# Patient Record
Sex: Female | Born: 1935 | Race: White | Hispanic: No | Smoking: Never smoker
Health system: Southern US, Community
[De-identification: ages and names within clinical notes are randomized; demographics above are authoritative.]

## PROBLEM LIST (undated history)

## (undated) DIAGNOSIS — N329 Bladder disorder, unspecified: Secondary | ICD-10-CM

## (undated) DIAGNOSIS — K5900 Constipation, unspecified: Secondary | ICD-10-CM

## (undated) DIAGNOSIS — K219 Gastro-esophageal reflux disease without esophagitis: Secondary | ICD-10-CM

## (undated) DIAGNOSIS — H409 Unspecified glaucoma: Secondary | ICD-10-CM

## (undated) DIAGNOSIS — C801 Malignant (primary) neoplasm, unspecified: Secondary | ICD-10-CM

## (undated) DIAGNOSIS — B029 Zoster without complications: Secondary | ICD-10-CM

## (undated) DIAGNOSIS — R269 Unspecified abnormalities of gait and mobility: Secondary | ICD-10-CM

## (undated) DIAGNOSIS — Z8719 Personal history of other diseases of the digestive system: Secondary | ICD-10-CM

## (undated) DIAGNOSIS — I6529 Occlusion and stenosis of unspecified carotid artery: Secondary | ICD-10-CM

## (undated) DIAGNOSIS — IMO0002 Reserved for concepts with insufficient information to code with codable children: Secondary | ICD-10-CM

## (undated) DIAGNOSIS — IMO0001 Reserved for inherently not codable concepts without codable children: Secondary | ICD-10-CM

## (undated) DIAGNOSIS — Z79890 Hormone replacement therapy (postmenopausal): Secondary | ICD-10-CM

## (undated) DIAGNOSIS — D0510 Intraductal carcinoma in situ of unspecified breast: Secondary | ICD-10-CM

## (undated) DIAGNOSIS — M810 Age-related osteoporosis without current pathological fracture: Secondary | ICD-10-CM

## (undated) DIAGNOSIS — Z923 Personal history of irradiation: Secondary | ICD-10-CM

## (undated) DIAGNOSIS — Z87442 Personal history of urinary calculi: Secondary | ICD-10-CM

## (undated) DIAGNOSIS — Z972 Presence of dental prosthetic device (complete) (partial): Secondary | ICD-10-CM

## (undated) DIAGNOSIS — N200 Calculus of kidney: Secondary | ICD-10-CM

## (undated) DIAGNOSIS — I700 Atherosclerosis of aorta: Secondary | ICD-10-CM

## (undated) DIAGNOSIS — D649 Anemia, unspecified: Secondary | ICD-10-CM

## (undated) DIAGNOSIS — I2510 Atherosclerotic heart disease of native coronary artery without angina pectoris: Secondary | ICD-10-CM

## (undated) DIAGNOSIS — R159 Full incontinence of feces: Secondary | ICD-10-CM

## (undated) DIAGNOSIS — H9190 Unspecified hearing loss, unspecified ear: Secondary | ICD-10-CM

## (undated) DIAGNOSIS — M1990 Unspecified osteoarthritis, unspecified site: Secondary | ICD-10-CM

## (undated) DIAGNOSIS — M7750 Other enthesopathy of unspecified foot: Secondary | ICD-10-CM

## (undated) HISTORY — DX: Unspecified abnormalities of gait and mobility: R26.9

## (undated) HISTORY — DX: Unspecified hearing loss, unspecified ear: H91.90

## (undated) HISTORY — DX: Calculus of kidney: N20.0

## (undated) HISTORY — DX: Zoster without complications: B02.9

## (undated) HISTORY — DX: Occlusion and stenosis of unspecified carotid artery: I65.29

## (undated) HISTORY — PX: LITHOTRIPSY: SUR834

## (undated) HISTORY — DX: Malignant (primary) neoplasm, unspecified: C80.1

## (undated) HISTORY — DX: Gastro-esophageal reflux disease without esophagitis: K21.9

## (undated) HISTORY — DX: Reserved for concepts with insufficient information to code with codable children: IMO0002

## (undated) HISTORY — DX: Constipation, unspecified: K59.00

## (undated) HISTORY — DX: Atherosclerotic heart disease of native coronary artery without angina pectoris: I25.10

## (undated) HISTORY — DX: Full incontinence of feces: R15.9

## (undated) HISTORY — DX: Bladder disorder, unspecified: N32.9

## (undated) HISTORY — DX: Atherosclerosis of aorta: I70.0

## (undated) HISTORY — DX: Reserved for inherently not codable concepts without codable children: IMO0001

## (undated) HISTORY — DX: Age-related osteoporosis without current pathological fracture: M81.0

## (undated) HISTORY — DX: Anemia, unspecified: D64.9

## (undated) HISTORY — DX: Hormone replacement therapy: Z79.890

## (undated) HISTORY — DX: Unspecified osteoarthritis, unspecified site: M19.90

## (undated) HISTORY — PX: OTHER SURGICAL HISTORY: SHX169

## (undated) HISTORY — DX: Unspecified glaucoma: H40.9

---

## 1975-06-19 HISTORY — PX: VAGINAL HYSTERECTOMY: SUR661

## 1980-06-18 HISTORY — PX: HERNIA REPAIR: SHX51

## 1993-06-18 HISTORY — PX: CHOLECYSTECTOMY: SHX55

## 1997-11-01 ENCOUNTER — Other Ambulatory Visit: Admission: RE | Admit: 1997-11-01 | Discharge: 1997-11-01 | Payer: Self-pay | Admitting: *Deleted

## 1998-01-06 ENCOUNTER — Ambulatory Visit (HOSPITAL_COMMUNITY): Admission: RE | Admit: 1998-01-06 | Discharge: 1998-01-06 | Payer: Self-pay | Admitting: Gastroenterology

## 1999-01-17 ENCOUNTER — Encounter (INDEPENDENT_AMBULATORY_CARE_PROVIDER_SITE_OTHER): Payer: Self-pay | Admitting: Specialist

## 1999-01-17 ENCOUNTER — Other Ambulatory Visit: Admission: RE | Admit: 1999-01-17 | Discharge: 1999-01-17 | Payer: Self-pay | Admitting: Gastroenterology

## 1999-02-02 ENCOUNTER — Other Ambulatory Visit: Admission: RE | Admit: 1999-02-02 | Discharge: 1999-02-02 | Payer: Self-pay | Admitting: *Deleted

## 1999-05-26 ENCOUNTER — Observation Stay (HOSPITAL_COMMUNITY): Admission: RE | Admit: 1999-05-26 | Discharge: 1999-05-27 | Payer: Self-pay | Admitting: Urology

## 1999-12-05 ENCOUNTER — Other Ambulatory Visit: Admission: RE | Admit: 1999-12-05 | Discharge: 1999-12-05 | Payer: Self-pay | Admitting: Gastroenterology

## 1999-12-05 ENCOUNTER — Encounter (INDEPENDENT_AMBULATORY_CARE_PROVIDER_SITE_OTHER): Payer: Self-pay | Admitting: Specialist

## 2001-02-06 ENCOUNTER — Encounter (INDEPENDENT_AMBULATORY_CARE_PROVIDER_SITE_OTHER): Payer: Self-pay | Admitting: Specialist

## 2001-02-06 ENCOUNTER — Other Ambulatory Visit: Admission: RE | Admit: 2001-02-06 | Discharge: 2001-02-06 | Payer: Self-pay | Admitting: Gastroenterology

## 2001-02-13 ENCOUNTER — Encounter: Payer: Self-pay | Admitting: Gastroenterology

## 2001-02-13 ENCOUNTER — Ambulatory Visit (HOSPITAL_COMMUNITY): Admission: RE | Admit: 2001-02-13 | Discharge: 2001-02-13 | Payer: Self-pay | Admitting: Gastroenterology

## 2001-07-21 ENCOUNTER — Other Ambulatory Visit: Admission: RE | Admit: 2001-07-21 | Discharge: 2001-07-21 | Payer: Self-pay | Admitting: Obstetrics and Gynecology

## 2002-10-01 ENCOUNTER — Other Ambulatory Visit: Admission: RE | Admit: 2002-10-01 | Discharge: 2002-10-01 | Payer: Self-pay | Admitting: Obstetrics and Gynecology

## 2004-02-17 DIAGNOSIS — C801 Malignant (primary) neoplasm, unspecified: Secondary | ICD-10-CM

## 2004-02-17 HISTORY — DX: Malignant (primary) neoplasm, unspecified: C80.1

## 2004-02-17 HISTORY — PX: OTHER SURGICAL HISTORY: SHX169

## 2008-09-10 ENCOUNTER — Encounter: Admission: RE | Admit: 2008-09-10 | Discharge: 2008-09-10 | Payer: Self-pay | Admitting: Family Medicine

## 2009-04-11 ENCOUNTER — Ambulatory Visit (HOSPITAL_COMMUNITY): Admission: RE | Admit: 2009-04-11 | Discharge: 2009-04-11 | Payer: Self-pay | Admitting: Urology

## 2009-12-01 ENCOUNTER — Telehealth: Payer: Self-pay | Admitting: Internal Medicine

## 2010-06-18 HISTORY — PX: COLONOSCOPY: SHX174

## 2010-11-17 DIAGNOSIS — N329 Bladder disorder, unspecified: Secondary | ICD-10-CM

## 2010-11-17 HISTORY — PX: INCONTINENCE SURGERY: SHX676

## 2010-11-17 HISTORY — DX: Bladder disorder, unspecified: N32.9

## 2010-11-20 ENCOUNTER — Ambulatory Visit (HOSPITAL_BASED_OUTPATIENT_CLINIC_OR_DEPARTMENT_OTHER)
Admission: RE | Admit: 2010-11-20 | Discharge: 2010-11-21 | Disposition: A | Discharge status: Home / Self Care | Payer: Medicare Other | Source: Ambulatory Visit | Attending: Urology | Admitting: Urology

## 2010-11-20 ENCOUNTER — Ambulatory Visit (HOSPITAL_COMMUNITY)
Admission: RE | Admit: 2010-11-20 | Discharge: 2010-11-20 | Disposition: A | Discharge status: Home / Self Care | Payer: Medicare Other | Source: Ambulatory Visit | Attending: Urology | Admitting: Urology

## 2010-11-20 DIAGNOSIS — Z01818 Encounter for other preprocedural examination: Secondary | ICD-10-CM | POA: Insufficient documentation

## 2010-11-20 DIAGNOSIS — Z01812 Encounter for preprocedural laboratory examination: Secondary | ICD-10-CM | POA: Insufficient documentation

## 2010-11-20 DIAGNOSIS — K449 Diaphragmatic hernia without obstruction or gangrene: Secondary | ICD-10-CM | POA: Insufficient documentation

## 2010-11-20 DIAGNOSIS — N816 Rectocele: Secondary | ICD-10-CM | POA: Insufficient documentation

## 2010-11-20 DIAGNOSIS — Z01810 Encounter for preprocedural cardiovascular examination: Secondary | ICD-10-CM | POA: Insufficient documentation

## 2010-11-20 DIAGNOSIS — N393 Stress incontinence (female) (male): Secondary | ICD-10-CM | POA: Insufficient documentation

## 2010-11-20 LAB — POCT HEMOGLOBIN-HEMACUE: Hemoglobin: 14 g/dL (ref 12.0–15.0)

## 2010-11-23 NOTE — Op Note (Signed)
NAMESONDRA, BLIXT                ACCOUNT NO.:  [id number]  MEDICAL RECORD NO.:  [id number]  LOCATION:  XRAY                         FACILITY:  [facility]  PHYSICIAN:  Lakitha Gordy I. Patsi Sears, M.D.DATE OF BIRTH:  [birth year]  DATE OF PROCEDURE: DATE OF DISCHARGE:                              OPERATIVE REPORT   PREOPERATIVE DIAGNOSIS:  Stress urinary incontinence with intrinsic sphincter deficiency.  POSTOPERATIVE DIAGNOSIS:  Stress urinary incontinence with intrinsic sphincter deficiency.  OPERATION:  Lynx pubovaginal sling.  SURGEON:  Purvi Ruehl I. Patsi Sears, M.D.  ANESTHESIA:  General LMA.  OPERATION:  After appropriate preanesthesia, the patient is brought to the operating room, placed on the operating table in dorsal supine position where general LMA anesthesia was introduced.  She was then re- placed in dorsal lithotomy position where the pubis was prepped with Betadine solution and draped in usual fashion.  BRIEF HISTORY:  This is a 77 year old female who is status post TAH, and "bladder tacking" in 1978 per GYN.  She is also status post a dermal graft anterior vault and rectocele repair and sling using cardinal ligaments 12 years ago.  She currently leaks cough, laugh, sneeze, and leaks without awareness.  The urodynamics shows a low leak point pressure of 47 cm of water, repeat is 47, and third repeat is also low. She has a PVR of 67 mL and a flow rate of 8 mL per second.  She is now for lynx pubovaginal sling.  She has a known rectocele but is thought to be grade 2 and not in need of repair yet.  PROCEDURE:  Vaginal inspection reveals that the patient has indeed a posterior rectocele, grade 2.  There is no cystocele present.  The bladder neck is identified and marked with a blue pen.  Urethra appears tight.  Midurethral area is marked with a blue pen.  Two incisions were made two fingerbreadths up and 2 fingerbreadths lateral ward above the pubic tubercle, and following  these incisions, injection was accomplished in the vaginal area with Marcaine 0.25 with epinephrine 1:200,000 mixed in saline for hydrodissection.  10 mL is injected.  A midurethral incision is made measuring 15 mm.  Following this, dissection was accomplished on either side of the urethra, into the retropubic space.  There is a large amount of scar on the left side, but minimal score on the right side.  Following careful dissection on the left side, the lynx needles are placed bilaterally.  Cystoscopy was accomplished with both the 30-degree lens and the 70-degree lens, and no evidence of any bladder interruption was seen.  The needles appeared to exit the perineum at the level of bladder neck.  The mesh lynx sling was placed, and brought retropubically, so it is smooth and flat against the urethra.  Following this, the plastic sleeves are cut and removed with a right angle behind the sling in the midline.  Again, with the right angle behind the mesh in the midline, the wings were cut subcutaneously.  Wound was irrigated with antibiotic irrigation and closed in single layer with 2-0 Vicryl suture.  The suprapubic incisions were closed with Dermabond.  Vaginal packing was placed.  It was felt that the  patient did not have a bad enough rectocele to warrant treatment at this time.  Foley catheter was replaced.  After repeat cystoscopy showed indigo carmine urine and jets of indigo carmine from each ureter.  The patient received B and O suppository, IV Toradol.  She was awakened and taken to recovery in excellent condition.     Kentarius Partington I. Patsi Sears, M.D.     SIT/MEDQ  D:  11/20/2010  T:  11/20/2010  Job:  [id number]  Electronically Signed by Jethro Bolus M.D. on 11/23/2010 11:23:29 AM

## 2010-12-11 NOTE — Telephone Encounter (Signed)
Summary: Schedule Colonoscopy  Phone Note Outgoing Call Call back at Home Phone [phone]   Call placed by: Harlow Mares CMA [facility]),  December 01, 2009 2:41 PM Call placed to: Patient Summary of Call: called and spoke with the pts husband and he will give our number and Dr. Teresita Madura name to the patient when she returns home today so she can call back to schedule an office visit to discuss a colonoscopy. Initial call taken by: Harlow Mares CMA [facility]),  December 01, 2009 2:42 PM

## 2011-06-15 ENCOUNTER — Other Ambulatory Visit: Payer: Self-pay | Admitting: Family Medicine

## 2011-06-15 DIAGNOSIS — Z780 Asymptomatic menopausal state: Secondary | ICD-10-CM

## 2011-06-15 DIAGNOSIS — Z1231 Encounter for screening mammogram for malignant neoplasm of breast: Secondary | ICD-10-CM

## 2011-07-04 ENCOUNTER — Ambulatory Visit
Admission: RE | Admit: 2011-07-04 | Discharge: 2011-07-04 | Disposition: A | Discharge status: Home / Self Care | Payer: Medicare Other | Source: Ambulatory Visit | Attending: Family Medicine | Admitting: Family Medicine

## 2011-07-04 DIAGNOSIS — Z780 Asymptomatic menopausal state: Secondary | ICD-10-CM

## 2011-07-04 DIAGNOSIS — Z1231 Encounter for screening mammogram for malignant neoplasm of breast: Secondary | ICD-10-CM

## 2011-12-17 HISTORY — PX: FOOT SURGERY: SHX648

## 2012-07-23 ENCOUNTER — Other Ambulatory Visit (HOSPITAL_COMMUNITY)
Admission: RE | Admit: 2012-07-23 | Discharge: 2012-07-23 | Disposition: A | Discharge status: Home / Self Care | Payer: Medicare Other | Source: Ambulatory Visit | Attending: Family Medicine | Admitting: Family Medicine

## 2012-07-23 ENCOUNTER — Other Ambulatory Visit: Payer: Self-pay | Admitting: Family Medicine

## 2012-07-23 DIAGNOSIS — Z124 Encounter for screening for malignant neoplasm of cervix: Secondary | ICD-10-CM | POA: Insufficient documentation

## 2012-12-10 ENCOUNTER — Encounter: Payer: Self-pay | Admitting: Vascular Surgery

## 2012-12-10 ENCOUNTER — Other Ambulatory Visit: Payer: Self-pay

## 2012-12-10 DIAGNOSIS — R0989 Other specified symptoms and signs involving the circulatory and respiratory systems: Secondary | ICD-10-CM

## 2013-01-07 ENCOUNTER — Encounter: Payer: Self-pay | Admitting: Vascular Surgery

## 2013-01-08 ENCOUNTER — Ambulatory Visit (INDEPENDENT_AMBULATORY_CARE_PROVIDER_SITE_OTHER): Payer: Medicare Other | Admitting: Vascular Surgery

## 2013-01-08 ENCOUNTER — Encounter: Payer: Self-pay | Admitting: Vascular Surgery

## 2013-01-08 ENCOUNTER — Other Ambulatory Visit (INDEPENDENT_AMBULATORY_CARE_PROVIDER_SITE_OTHER): Payer: Medicare Other

## 2013-01-08 DIAGNOSIS — I6529 Occlusion and stenosis of unspecified carotid artery: Secondary | ICD-10-CM

## 2013-01-08 DIAGNOSIS — Z9289 Personal history of other medical treatment: Secondary | ICD-10-CM

## 2013-01-08 DIAGNOSIS — R0989 Other specified symptoms and signs involving the circulatory and respiratory systems: Secondary | ICD-10-CM

## 2013-01-08 NOTE — Progress Notes (Signed)
History of Present Illness:  Patient is a 79 y.o. year old female who presents for evaluation of carotid stenosis.  The patient denies symptoms of TIA, amaurosis, or stroke.  The patient is currently on no antiplatelet therapy.  The carotid stenosis was found on duplex to evaluate for carotid bruit.  Other medical problems include prior history of esophageal cancer and reflux.  These are currently stable and followed by Dr. Wynelle Link.   Past Medical History  Diagnosis Date  . Carotid artery occlusion     Bruit  . Glaucoma   . Postmenopausal HRT (hormone replacement therapy)   . GERD (gastroesophageal reflux disease)   . Osteoporosis   . Allergy     seasonal  . Gait abnormality   . Incontinence of bowel   . Constipation   . Esophageal hiatal hernia     upper  . Bladder disorder June 2012    Sling  . Kidney stone   . Bone spur of right ankle   . Weakness   . Hearing impaired   . Dyspnea on exertion   . Arthritis     osteoarthritis  . Back pain   . Cancer 02/2004    Esophageal   . Anemia     Past Surgical History  Procedure Laterality Date  . Esopheageal cancer  02/2004    Removed   . Hernial repain  1980  . Hernia repair  1982    corrective surgery  . Incontinence surgery  june 2012  . Cholecystectomy  1995    Gall Bladder  . Abdominal hysterectomy  1997  . Exploration neck for postop hemorrhage / thrombosis / infection    . Foot surgery Right July 2013    Bone spur     Social History History  Substance Use Topics  . Smoking status: Never Smoker   . Smokeless tobacco: Never Used  . Alcohol Use: No    Family History Family History  Problem Relation Age of Onset  . Hernia Mother     Hiatal  . Diabetes Mother   . Heart attack Mother 13  . Stroke Mother 65  . Hypertension Father   . Stroke Father 64  . Diabetes Father   . Hyperlipidemia Father   . Stroke Sister   . Cancer Brother     colon   . Heart attack Maternal Grandmother   . Hernia Maternal Grandmother    . Stroke Paternal Grandmother 66  . Stroke Paternal Grandfather 69  . Cancer Brother     Esophageal    Allergies  Allergies  Allergen Reactions  . Shellfish Allergy Diarrhea, Nausea Only and Rash  . Iodine      Current Outpatient Prescriptions  Medication Sig Dispense Refill  . b complex vitamins tablet Take 1 tablet by mouth daily.      . Biotin 1000 MCG tablet Take 1,000 mcg by mouth 3 (three) times daily. Hair, skin, nails      . calcium carbonate (OS-CAL) 600 MG TABS Take 600 mg by mouth 2 (two) times daily with a meal.      . Cholecalciferol (VITAMIN D3) 3000 UNITS TABS Take 2,000 mg by mouth.      . diphenhydrAMINE (BENADRYL) 25 MG tablet Take 25 mg by mouth every 6 (six) hours as needed for itching.      . esomeprazole (NEXIUM) 40 MG capsule Take 40 mg by mouth daily before breakfast.      . estrogens, conjugated, (PREMARIN) 0.3 MG tablet Take  0.3 mg by mouth daily. Take daily for 21 days then do not take for 7 days.      . famotidine (PEPCID) 20 MG tablet Take 20 mg by mouth 2 (two) times daily.      . Loratadine (CLARITIN REDITABS) 5 MG TBDP Take by mouth.      . diphenhydrAMINE (SOMINEX) 25 MG tablet Take 25 mg by mouth at bedtime as needed for sleep.       No current facility-administered medications for this visit.    ROS:   General:  No weight loss, Fever, chills  HEENT: No recent headaches, no nasal bleeding, no visual changes, no sore throat  Neurologic: No dizziness, blackouts, seizures. No recent symptoms of stroke or mini- stroke. No recent episodes of slurred speech, or temporary blindness.  Cardiac: No recent episodes of chest pain/pressure, no shortness of breath at rest.  No shortness of breath with exertion.  Denies history of atrial fibrillation or irregular heartbeat  Vascular: No history of rest pain in feet.  No history of claudication.  No history of non-healing ulcer, No history of DVT   Pulmonary: No home oxygen, no productive cough, no  hemoptysis,  No asthma or wheezing  Musculoskeletal:  [ ]  Arthritis, [ ]  Low back pain,  [ ]  Joint pain  Hematologic:No history of hypercoagulable state.  No history of easy bleeding.  No history of anemia  Gastrointestinal: No hematochezia or melena,  No gastroesophageal reflux, no trouble swallowing.  She has no history of peptic ulcer disease.  Urinary: [ ]  chronic Kidney disease, [ ]  on HD - [ ]  MWF or [ ]  TTHS, [ ]  Burning with urination, [ ]  Frequent urination, [ ]  Difficulty urinating;   Skin: No rashes  Psychological: No history of anxiety,  No history of depression   Physical Examination  Filed Vitals:   01/08/13 1043 01/08/13 1046  BP: 119/66 110/59  Pulse: 64 65  Resp: 16   Height: 5\' 2"  (1.575 m)   Weight: 139 lb (63.05 kg)   SpO2: 97%     Body mass index is 25.42 kg/(m^2).  General:  Alert and oriented, no acute distress HEENT: Normal Neck: No bruit or JVD Pulmonary: Clear to auscultation bilaterally Cardiac: Regular Rate and Rhythm without murmur Gastrointestinal: Soft, non-tender, non-distended, no mass, midline laparotomy scar was 4 cm ventral hernia defect, no palpable mass Skin: No rash Extremity Pulses:  2+ radial, brachial, femoral bilaterally Musculoskeletal: No deformity or edema  Neurologic: Upper and lower extremity motor 5/5 and symmetric  DATA:  Patient had a carotid duplex exam today I reviewed and interpreted this study. The right internal carotid artery was 40-60% left less than 40% she had antegrade vertebral flow bilaterally   ASSESSMENT:   Asymptomatic mild to moderate bilateral carotid stenosis right greater than left.   PLAN:   #1 start aspirin 81 mg once daily #2 patient needs a fasting lipid profile to consider whether or not she would benefit from a statin  #3 carotid duplex 6 months   Fabienne Bruns, MD [facility] Office: [phone] Pager: [phone]

## 2013-01-09 ENCOUNTER — Other Ambulatory Visit: Payer: Self-pay | Admitting: *Deleted

## 2013-01-13 LAB — LIPID PANEL
Cholesterol: 199 mg/dL (ref 0–200)
Triglycerides: 90 mg/dL (ref <150)

## 2013-01-14 ENCOUNTER — Encounter: Payer: Self-pay | Admitting: Vascular Surgery

## 2013-01-14 NOTE — Progress Notes (Unsigned)
Patient ID: Angelica Roberts, female   DOB: 01/04/1934, 79 y.o.   MRN: [id number]    Cholesterol  199   Triglycerides  90   <150 mg/dL HDL  75   >40 mg/dL Total CHOL/HDL Ratio  2.7   Ratio VLDL  18   0 - 40 mg/dL LDL Cholesterol  981 (H)   0 - 99 mg/dL Comments:   Pt called results discussed with pt and forwarded to primary MD  Fabienne Bruns, MD [facility] [place] Office: [phone] Pager: [phone]

## 2013-07-15 ENCOUNTER — Encounter: Payer: Self-pay | Admitting: Family

## 2013-07-16 ENCOUNTER — Ambulatory Visit (INDEPENDENT_AMBULATORY_CARE_PROVIDER_SITE_OTHER): Payer: Medicare HMO | Admitting: Family

## 2013-07-16 ENCOUNTER — Encounter: Payer: Self-pay | Admitting: Family

## 2013-07-16 ENCOUNTER — Ambulatory Visit (HOSPITAL_COMMUNITY)
Admission: RE | Admit: 2013-07-16 | Discharge: 2013-07-16 | Disposition: A | Discharge status: Home / Self Care | Payer: Medicare HMO | Source: Ambulatory Visit | Attending: Vascular Surgery | Admitting: Vascular Surgery

## 2013-07-16 VITALS — BP 105/67 | HR 70 | Resp 16 | Ht 62.0 in | Wt 144.0 lb

## 2013-07-16 DIAGNOSIS — I6529 Occlusion and stenosis of unspecified carotid artery: Secondary | ICD-10-CM | POA: Insufficient documentation

## 2013-07-16 DIAGNOSIS — I658 Occlusion and stenosis of other precerebral arteries: Secondary | ICD-10-CM | POA: Insufficient documentation

## 2013-07-16 NOTE — Patient Instructions (Signed)

## 2013-07-16 NOTE — Progress Notes (Signed)
Established Carotid Patient   History of Present Illness  Angelica Roberts is a 80 y.o. female patient of Dr. Oneida Alar who has known carotid stenosis. She returns today for carotid artery surveillance.  Patient has not had previous carotid artery intervention.  Patient has Negative history of TIA or stroke symptom.  The patient denies amaurosis fugax or monocular blindness.  The patient  denies facial drooping.  Pt. denies hemiplegia.  The patient denies receptive or expressive aphasia.  Pt. denies extremity weakness. Pt denies claudication symptoms, denies non healing wounds. Pt states she has bad GERD and has a history of esophogeal cancer.  Pt denies New Medical or Surgical History. Pt states she mows her large yard every week with a push mower, and walks a mile daily.  Pt Diabetic: No Pt smoker: non-smoker  Pt meds include: Statin : No: Dr. Oneida Alar checked her cholesterol, pt states it was good and she does not need medication ASA: No Other anticoagulants/antiplatelets: no   Past Medical History  Diagnosis Date  . Carotid artery occlusion     Bruit  . Glaucoma   . Postmenopausal HRT (hormone replacement therapy)   . GERD (gastroesophageal reflux disease)   . Osteoporosis   . Allergy     seasonal  . Gait abnormality   . Incontinence of bowel   . Constipation   . Esophageal hiatal hernia     upper  . Bladder disorder June 2012    Sling  . Kidney stone   . Bone spur of right ankle   . Weakness   . Hearing impaired   . Dyspnea on exertion   . Arthritis     osteoarthritis  . Back pain   . Cancer 02/2004    Esophageal   . Anemia     Social History History  Substance Use Topics  . Smoking status: Never Smoker   . Smokeless tobacco: Never Used  . Alcohol Use: No    Family History Family History  Problem Relation Age of Onset  . Hernia Mother     Hiatal  . Diabetes Mother   . Heart attack Mother 65  . Stroke Mother 29  . Hypertension Father   . Stroke  Father 51  . Diabetes Father   . Hyperlipidemia Father   . Stroke Sister   . Cancer Brother     colon   . Heart attack Maternal Grandmother   . Hernia Maternal Grandmother   . Stroke Paternal Grandmother 60  . Stroke Paternal Grandfather 9  . Cancer Brother     Esophageal    Surgical History Past Surgical History  Procedure Laterality Date  . Esopheageal cancer  02/2004    Removed   . Hernial repain  1980  . Hernia repair  1982    corrective surgery  . Incontinence surgery  june 2012  . Cholecystectomy  1995    Gall Bladder  . Abdominal hysterectomy  1997  . Exploration neck for postop hemorrhage / thrombosis / infection    . Foot surgery Right July 2013    Bone spur    Allergies  Allergen Reactions  . Shellfish Allergy Diarrhea, Nausea Only and Rash  . Iodine     Current Outpatient Prescriptions  Medication Sig Dispense Refill  . b complex vitamins tablet Take 1 tablet by mouth daily.      . Biotin 1000 MCG tablet Take 1,000 mcg by mouth 3 (three) times daily. Hair, skin, nails      .  calcium carbonate (OS-CAL) 600 MG TABS Take 600 mg by mouth 2 (two) times daily with a meal.      . Cholecalciferol (VITAMIN D3) 3000 UNITS TABS Take 2,000 mg by mouth.      . conjugated estrogens (PREMARIN) vaginal cream Place 1 Applicatorful vaginally every other day.      . denosumab (PROLIA) 60 MG/ML SOLN injection Inject 60 mg into the skin every 6 (six) months. Administer in upper arm, thigh, or abdomen      . diphenhydrAMINE (BENADRYL) 25 MG tablet Take 25 mg by mouth every 6 (six) hours as needed for itching.      . esomeprazole (NEXIUM) 40 MG capsule Take 40 mg by mouth daily before breakfast.      . famotidine (PEPCID) 20 MG tablet Take 20 mg by mouth 2 (two) times daily.      . Loratadine (CLARITIN REDITABS) 5 MG TBDP Take by mouth.      . diphenhydrAMINE (SOMINEX) 25 MG tablet Take 25 mg by mouth at bedtime as needed for sleep.      Marland Kitchen estrogens, conjugated, (PREMARIN) 0.3  MG tablet Take 0.3 mg by mouth daily. Take daily for 21 days then do not take for 7 days.       No current facility-administered medications for this visit.    Review of Systems : See HPI for pertinent positives and negatives.  Physical Examination  Filed Vitals:   07/16/13 1156  BP: 105/67  Pulse: 70  Resp: 16   Filed Weights   07/16/13 1156  Weight: 144 lb (65.318 kg)   Body mass index is 26.33 kg/(m^2).   General: WDWN female in NAD GAIT: normal Eyes: PERRLA Pulmonary:  CTAB, Negative  Rales, Negative rhonchi, & Negative wheezing.  Cardiac: regular Rhythm ,  Negative Murmurs.  VASCULAR EXAM Carotid Bruits Left Right   Negative Negative    Radial pulses are 2+ palpable and equal.                                                                                                                            LE Pulses LEFT RIGHT       POPLITEAL  not palpable   not palpable       POSTERIOR TIBIAL  not palpable    palpable        DORSALIS PEDIS      ANTERIOR TIBIAL  palpable   palpable     Gastrointestinal: soft, nontender, BS WNL, no r/g,  positive masses.  Musculoskeletal: Negative muscle atrophy/wasting. M/S 5/5 throughout, Extremities without ischemic changes.  Neurologic: A&O X 3; Appropriate Affect ; SENSATION ;normal;  Speech is normal CN 2-12 intact, Pain and light touch intact in extremities, Motor exam as listed above.   Non-Invasive Vascular Imaging CAROTID DUPLEX 07/16/2013   Right ICA: 40 - 59 % stenosis. Left ICA: no stenosis.  These findings are Unchanged from previous exam.  Assessment: Angelica Roberts is a  80 y.o. female who presents with asymptomatic mild Right ICA stenosis and no left ICA stenosis. The  ICA stenosis is  Unchanged from previous exam. Will not add daily 81 mg ASA as she has severe GERD and a history of Barrett's esophagus. Pt has no atherosclerotic risk factors other than age.  Plan: Follow-up in 1 year with Carotid Duplex  scan.   I discussed in depth with the patient the nature of atherosclerosis, and emphasized the importance of maximal medical management including strict control of blood pressure, blood glucose, and lipid levels, obtaining regular exercise, and continued cessation of smoking.  The patient is aware that without maximal medical management the underlying atherosclerotic disease process will progress, limiting the benefit of any interventions. The patient was given information about stroke prevention and what symptoms should prompt the patient to seek immediate medical care. Thank you for allowing [place] to participate in this patient's care.  Clemon Chambers, RN, MSN, FNP-C [facility] [place] Office: [phone]  Clinic Physician: Oneida Alar  07/16/2013 12:06 PM

## 2013-07-16 NOTE — Addendum Note (Signed)
Addended by: Mena Goes on: 07/16/2013 01:46 PM   Modules accepted: Orders

## 2013-08-26 ENCOUNTER — Other Ambulatory Visit: Payer: Self-pay

## 2013-08-26 DIAGNOSIS — Z1231 Encounter for screening mammogram for malignant neoplasm of breast: Secondary | ICD-10-CM

## 2013-09-03 ENCOUNTER — Other Ambulatory Visit: Payer: Self-pay | Admitting: Family Medicine

## 2013-09-03 DIAGNOSIS — M810 Age-related osteoporosis without current pathological fracture: Secondary | ICD-10-CM

## 2013-09-09 ENCOUNTER — Ambulatory Visit
Admission: RE | Admit: 2013-09-09 | Discharge: 2013-09-09 | Disposition: A | Discharge status: Home / Self Care | Payer: Medicare HMO | Source: Ambulatory Visit

## 2013-09-09 DIAGNOSIS — Z1231 Encounter for screening mammogram for malignant neoplasm of breast: Secondary | ICD-10-CM

## 2013-09-10 ENCOUNTER — Other Ambulatory Visit: Payer: Self-pay | Admitting: Family Medicine

## 2013-09-10 DIAGNOSIS — R928 Other abnormal and inconclusive findings on diagnostic imaging of breast: Secondary | ICD-10-CM

## 2013-09-15 ENCOUNTER — Ambulatory Visit
Admission: RE | Admit: 2013-09-15 | Discharge: 2013-09-15 | Disposition: A | Discharge status: Home / Self Care | Payer: Medicare HMO | Source: Ambulatory Visit | Attending: Family Medicine | Admitting: Family Medicine

## 2013-09-15 DIAGNOSIS — M810 Age-related osteoporosis without current pathological fracture: Secondary | ICD-10-CM

## 2013-09-21 ENCOUNTER — Ambulatory Visit
Admission: RE | Admit: 2013-09-21 | Discharge: 2013-09-21 | Disposition: A | Discharge status: Home / Self Care | Payer: Self-pay | Source: Ambulatory Visit | Attending: Family Medicine | Admitting: Family Medicine

## 2013-09-21 DIAGNOSIS — R928 Other abnormal and inconclusive findings on diagnostic imaging of breast: Secondary | ICD-10-CM

## 2013-09-21 HISTORY — PX: BREAST BIOPSY: SHX20

## 2013-09-22 ENCOUNTER — Other Ambulatory Visit: Payer: Self-pay | Admitting: Family Medicine

## 2013-09-22 DIAGNOSIS — D0510 Intraductal carcinoma in situ of unspecified breast: Secondary | ICD-10-CM

## 2013-09-24 ENCOUNTER — Telehealth: Payer: Self-pay | Admitting: *Deleted

## 2013-09-24 NOTE — Telephone Encounter (Signed)
Left message for a return phone call to schedule for [facility] 09/30/13.

## 2013-09-25 ENCOUNTER — Telehealth: Payer: Self-pay | Admitting: *Deleted

## 2013-09-25 DIAGNOSIS — C50412 Malignant neoplasm of upper-outer quadrant of left female breast: Secondary | ICD-10-CM

## 2013-09-25 NOTE — Telephone Encounter (Signed)
Confirmed [facility] for 09/30/13 at 12N .  Instructions and contact information given.

## 2013-09-28 ENCOUNTER — Ambulatory Visit
Admission: RE | Admit: 2013-09-28 | Discharge: 2013-09-28 | Disposition: A | Discharge status: Home / Self Care | Payer: Medicare HMO | Source: Ambulatory Visit | Attending: Family Medicine | Admitting: Family Medicine

## 2013-09-28 DIAGNOSIS — D0510 Intraductal carcinoma in situ of unspecified breast: Secondary | ICD-10-CM

## 2013-09-28 MED ORDER — GADOBENATE DIMEGLUMINE 529 MG/ML IV SOLN
13.0000 mL | Freq: Once | INTRAVENOUS | Status: AC | PRN
  Administered 2013-09-28: 13 mL via INTRAVENOUS

## 2013-09-30 ENCOUNTER — Ambulatory Visit
Admission: RE | Admit: 2013-09-30 | Discharge: 2013-09-30 | Disposition: A | Discharge status: Home / Self Care | Payer: Medicare HMO | Source: Ambulatory Visit | Attending: Radiation Oncology | Admitting: Radiation Oncology

## 2013-09-30 ENCOUNTER — Ambulatory Visit: Payer: Medicare HMO | Admitting: Physical Therapy

## 2013-09-30 ENCOUNTER — Encounter: Payer: Self-pay | Admitting: Radiation Oncology

## 2013-09-30 ENCOUNTER — Encounter: Payer: Self-pay | Admitting: Oncology

## 2013-09-30 ENCOUNTER — Ambulatory Visit (INDEPENDENT_AMBULATORY_CARE_PROVIDER_SITE_OTHER): Payer: Medicare HMO | Admitting: General Surgery

## 2013-09-30 ENCOUNTER — Ambulatory Visit: Payer: Medicare HMO

## 2013-09-30 ENCOUNTER — Ambulatory Visit (HOSPITAL_BASED_OUTPATIENT_CLINIC_OR_DEPARTMENT_OTHER): Payer: Medicare HMO | Admitting: Oncology

## 2013-09-30 ENCOUNTER — Other Ambulatory Visit (HOSPITAL_BASED_OUTPATIENT_CLINIC_OR_DEPARTMENT_OTHER): Payer: Medicare HMO

## 2013-09-30 VITALS — BP 131/72 | HR 74 | Temp 98.0°F | Resp 18 | Ht 62.0 in | Wt 143.4 lb

## 2013-09-30 DIAGNOSIS — Z170 Estrogen receptor positive status [ER+]: Secondary | ICD-10-CM

## 2013-09-30 DIAGNOSIS — D0590 Unspecified type of carcinoma in situ of unspecified breast: Secondary | ICD-10-CM

## 2013-09-30 DIAGNOSIS — C50919 Malignant neoplasm of unspecified site of unspecified female breast: Secondary | ICD-10-CM

## 2013-09-30 DIAGNOSIS — C50412 Malignant neoplasm of upper-outer quadrant of left female breast: Secondary | ICD-10-CM | POA: Insufficient documentation

## 2013-09-30 LAB — CBC WITH DIFFERENTIAL/PLATELET
BASO%: 0.5 % (ref 0.0–2.0)
BASOS ABS: 0 10*3/uL (ref 0.0–0.1)
EOS%: 1.7 % (ref 0.0–7.0)
Eosinophils Absolute: 0.1 10*3/uL (ref 0.0–0.5)
HCT: 39.6 % (ref 34.8–46.6)
HEMOGLOBIN: 12.9 g/dL (ref 11.6–15.9)
LYMPH#: 2.1 10*3/uL (ref 0.9–3.3)
LYMPH%: 25.1 % (ref 14.0–49.7)
MCH: 30.1 pg (ref 25.1–34.0)
MCHC: 32.7 g/dL (ref 31.5–36.0)
MCV: 92.3 fL (ref 79.5–101.0)
MONO#: 0.5 10*3/uL (ref 0.1–0.9)
MONO%: 5.7 % (ref 0.0–14.0)
NEUT#: 5.6 10*3/uL (ref 1.5–6.5)
NEUT%: 67 % (ref 38.4–76.8)
PLATELETS: 309 10*3/uL (ref 145–400)
RBC: 4.3 10*6/uL (ref 3.70–5.45)
RDW: 13.7 % (ref 11.2–14.5)
WBC: 8.4 10*3/uL (ref 3.9–10.3)

## 2013-09-30 LAB — COMPREHENSIVE METABOLIC PANEL (CC13)
ALT: 15 U/L (ref 0–55)
ANION GAP: 9 meq/L (ref 3–11)
AST: 21 U/L (ref 5–34)
Albumin: 3.8 g/dL (ref 3.5–5.0)
Alkaline Phosphatase: 74 U/L (ref 40–150)
BILIRUBIN TOTAL: 0.81 mg/dL (ref 0.20–1.20)
BUN: 15 mg/dL (ref 7.0–26.0)
CO2: 27 mEq/L (ref 22–29)
CREATININE: 0.9 mg/dL (ref 0.6–1.1)
Calcium: 9.8 mg/dL (ref 8.4–10.4)
Chloride: 108 mEq/L (ref 98–109)
Glucose: 105 mg/dl (ref 70–140)
Potassium: 3.7 mEq/L (ref 3.5–5.1)
Sodium: 144 mEq/L (ref 136–145)
Total Protein: 7.5 g/dL (ref 6.4–8.3)

## 2013-09-30 NOTE — Progress Notes (Signed)
Angelica Roberts [id number] Jan 25, 1934 80 y.o. 09/30/2013 1:28 PM  CC  Angelica Logan, MD [address], [address] [place] [address]  REASON FOR CONSULTATION:  80 year old female with ductal carcinoma in situ of the left breast. Patient is seen in the multidisciplinary breast clinic for discussion of treatment options.  STAGE:   Breast cancer   Primary site: Breast (Left)   Staging method: AJCC 7th Edition   Clinical: Stage 0 (Tis (DCIS), NX, cM0) signed by Deatra Robinson, MD on 09/30/2013  1:27 PM   Summary: Stage 0 (Tis (DCIS), NX, cM0)  REFERRING PHYSICIAN: Dr. Fanny Skates  HISTORY OF PRESENT ILLNESS:  Angelica Roberts is a 80 y.o. female.  Who is seen in the multidisciplinary breast clinic for new diagnosis of DCIS. Patient underwent a screening mammogram that showed suspicious calcifications in the upper-outer quadrant of the left breast. She had a biopsy performed that showed high grade ductal carcinoma in situ with associated necrosis and calcifications. MRI of the breasts showed biopsy changes in the upper outer quadrant of the left breast. No lymph nodes no evidence of any suspicious lesions on the right. Her case was discussed in the multidisciplinary breast conference. She is now seen in the [facility] for discussion of treatment options. She is without any complaints   Past Medical History: Past Medical History  Diagnosis Date  . Carotid artery occlusion     Bruit  . Glaucoma   . Postmenopausal HRT (hormone replacement therapy)   . GERD (gastroesophageal reflux disease)   . Osteoporosis   . Allergy     seasonal  . Gait abnormality   . Incontinence of bowel   . Constipation   . Esophageal hiatal hernia     upper  . Bladder disorder June 2012    Sling  . Kidney stone   . Bone spur of right ankle   . Weakness   . Hearing impaired   . Dyspnea on exertion   . Arthritis     osteoarthritis  . Back pain   . Cancer 02/2004    Esophageal   . Anemia      Past Surgical History: Past Surgical History  Procedure Laterality Date  . Esopheageal cancer  02/2004    Removed   . Hernial repain  1980  . Hernia repair  1982    corrective surgery  . Incontinence surgery  june 2012  . Cholecystectomy  1995    Gall Bladder  . Abdominal hysterectomy  1997  . Exploration neck for postop hemorrhage / thrombosis / infection    . Foot surgery Right July 2013    Bone spur    Family History: Family History  Problem Relation Age of Onset  . Hernia Mother     Hiatal  . Diabetes Mother   . Heart attack Mother 90  . Stroke Mother 42  . Hypertension Father   . Stroke Father 84  . Diabetes Father   . Hyperlipidemia Father   . Stroke Sister   . Cancer Brother     colon   . Heart attack Maternal Grandmother   . Hernia Maternal Grandmother   . Stroke Paternal Grandmother 72  . Stroke Paternal Grandfather 19  . Cancer Brother     Esophageal    Social History History  Substance Use Topics  . Smoking status: Never Smoker   . Smokeless tobacco: Never Used  . Alcohol Use: No    Allergies: Allergies  Allergen Reactions  .  Shellfish Allergy Diarrhea, Nausea Only and Rash  . Iodine     Current Medications: Current Outpatient Prescriptions  Medication Sig Dispense Refill  . b complex vitamins tablet Take 1 tablet by mouth daily.      . Biotin 1000 MCG tablet Take 1,000 mcg by mouth 3 (three) times daily. Hair, skin, nails      . calcium carbonate (OS-CAL) 600 MG TABS Take 600 mg by mouth 2 (two) times daily with a meal.      . Cholecalciferol (VITAMIN D3) 3000 UNITS TABS Take 2,000 mg by mouth.      . conjugated estrogens (PREMARIN) vaginal cream Place 1 Applicatorful vaginally every other day.      . denosumab (PROLIA) 60 MG/ML SOLN injection Inject 60 mg into the skin every 6 (six) months. Administer in upper arm, thigh, or abdomen      . diphenhydrAMINE (BENADRYL) 25 MG tablet Take 25 mg by mouth every 6 (six) hours as needed for  itching.      . diphenhydrAMINE (SOMINEX) 25 MG tablet Take 25 mg by mouth at bedtime as needed for sleep.      Marland Kitchen esomeprazole (NEXIUM) 40 MG capsule Take 40 mg by mouth daily before breakfast.      . estrogens, conjugated, (PREMARIN) 0.3 MG tablet Take 0.3 mg by mouth daily. Take daily for 21 days then do not take for 7 days.      . famotidine (PEPCID) 20 MG tablet Take 20 mg by mouth 2 (two) times daily.      . Loratadine (CLARITIN REDITABS) 5 MG TBDP Take by mouth.       No current facility-administered medications for this visit.    OB/GYN History: Menarche at age 44 she is postmenopausal she stopped hormone replacement therapy in 2005 first live birth at 44  Fertility Discussion: No Prior History of Cancer: Esophageal carcinoma  Health Maintenance:  Colonoscopy yes Bone Density yes Last PAP smear February 2014  ECOG PERFORMANCE STATUS: 1 - Symptomatic but completely ambulatory  Genetic Counseling/testing: Yes  REVIEW OF SYSTEMS:  A complete comprehensive 14 point review of systems was obtained and a scanned separately into the electronic medical record  PHYSICAL EXAMINATION: Blood pressure 131/72, pulse 74, temperature 98 F (36.7 C), temperature source Oral, resp. rate 18, height 5\' 2"  (1.575 m), weight 143 lb 6.4 oz (65.046 kg).  General:  well-nourished in no acute distress.  Eyes:  no scleral icterus.  ENT:  There were no oropharyngeal lesions.  Neck was without thyromegaly.  Lymphatics:  Negative cervical, supraclavicular or axillary adenopathy.  Respiratory: lungs were clear bilaterally without wheezing or crackles.  Cardiovascular:  Regular rate and rhythm, S1/S2, without murmur, rub or gallop.  There was no pedal edema.  GI:  abdomen was soft, flat, nontender, nondistended, without organomegaly.  Muscoloskeletal:  no spinal tenderness of palpation of vertebral spine.  Skin exam was without echymosis, petichae.  Neuro exam was nonfocal.  Patient was able to get on and off  exam table without assistance.  Gait was normal.  Patient was alerted and oriented.  Attention was good.   Language was appropriate.  Mood was normal without depression.  Speech was not pressured.  Thought content was not tangential.   Breasts: right breast normal without mass, skin or nipple changes or axillary nodes, left breast normal without mass, skin or nipple changes or axillary nodes.   STUDIES/RESULTS: Mr Breast Bilateral W Wo Contrast  09/28/2013   CLINICAL DATA:  80 year old  female with biopsy proven ductal carcinoma in situ with necrosis and calcifications from the upper-outer quadrant of the left breast.  LABS:  BUN and creatinine were obtained on site at [facility] at  [address]  Results:  BUN 11 mg/dL,  Creatinine 0.7 mg/dL.  EXAM: BILATERAL BREAST MRI WITH AND WITHOUT CONTRAST  TECHNIQUE: Multiplanar, multisequence MR images of both breasts were obtained prior to and following the intravenous administration of 53ml of MultiHance.  THREE-DIMENSIONAL MR IMAGE RENDERING ON INDEPENDENT WORKSTATION:  Three-dimensional MR images were rendered by post-processing of the original MR data on an independent workstation. The three-dimensional MR images were interpreted, and findings are reported in the following complete MRI report for this study. Three dimensional images were evaluated at the independent DynaCad workstation  COMPARISON:  Previous exams  FINDINGS: Breast composition: b.  Scattered fibroglandular tissue.  Background parenchymal enhancement: Moderate  Right breast: No mass or abnormal enhancement.  Left breast: Stereotactic biopsy was performed from a lateral approach. Biopsy changes are seen in the upper-outer quadrant of the breast. Non masslike patchy enhancement measuring 2.7 cm is seen around the biopsy site. Enhancement is likely secondary to biopsy changes. No additional areas of abnormal enhancement seen in left breast.  Lymph nodes: No abnormal appearing lymph  nodes.  Ancillary findings:  None.  IMPRESSION: Post biopsy changes in the upper-outer quadrant of the left breast corresponding to the area of the known ductal carcinoma in-situ.  RECOMMENDATION: Treatment planning of the known left breast ductal carcinoma in situ is recommended.  BI-RADS CATEGORY  6: Known biopsy-proven malignancy - appropriate action should be taken.   Electronically Signed   By: Lillia Mountain M.D.   On: 09/28/2013 13:12   Dg Bone Density  09/16/2013   CLINICAL DATA:  Postmenopausal.  EXAM: DUAL X-RAY ABSORPTIOMETRY (DXA) FOR BONE MINERAL DENSITY  FINDINGS: LEFT FEMUR NECK  Bone Mineral Density (BMD):  0.607 g/cm2  Young Adult T-Score: -2.2  Z-Score:  0.0  LEFT  FOREARM (1/3 RADIUS)  Bone Mineral Density (BMD):  0.542  Young Adult T Score:  -2.5  Z Score:  0.3  ASSESSMENT: Patient's diagnostic category is OSTEOPOROSIS by WHO Criteria.  FRACTURE RISK: INCREASED  FRAX: World Health Organization FRAX assessment of absolute fracture risk is not calculated for this patient because the patient has osteoporosis.  COMPARISON: The bone mineral density of the lumbar spine has increased by 8.8% compared to the 09/10/2008 study. The bone mineral density of the left forearm has not significantly changed compared to the 07/04/2011 study. For additional comparisons please see the mailed report.  Effective therapies are available in the form of bisphosphonates, selective estrogen receptor modulators, biologic agents, and hormone replacement therapy (for women). All patients should ensure an adequate intake of dietary calcium (1200 mg daily) and vitamin D (800 IU daily) unless contraindicated.  All treatment decisions require clinical judgment and consideration of individual patient factors, including patient preferences, co-morbidities, previous drug use, risk factors not captured in the FRAX model (e.g., frailty, falls, vitamin D deficiency, increased bone turnover, interval significant decline in bone density)  and possible under- or over-estimation of fracture risk by FRAX.  The National Osteoporosis Foundation recommends that FDA-approved medical therapies be considered in postmenopausal women and men age 7 or older with a:  1. Hip or vertebral (clinical or morphometric) fracture.  2. T-score of -2.5 or lower at the spine or hip.  3. Ten-year fracture probability by FRAX of 3% or greater for hip fracture  or 20% or greater for major osteoporotic fracture.  People with diagnosed cases of osteoporosis or at high risk for fracture should have regular bone mineral density tests. For patients eligible for Medicare, routine testing is allowed once every 2 years. The testing frequency can be increased to one year for patients who have rapidly progressing disease, those who are receiving or discontinuing medical therapy to restore bone mass, or have additional risk factors.  World Pharmacologist [facility]) Criteria:  Normal: T-scores from +1.0 to -1.0  Low Bone Mass (Osteopenia): T-scores between -1.0 and -2.5  Osteoporosis: T-scores -2.5 and below  Comparison to Reference Population:  T-score is the key measure used in the diagnosis of osteoporosis and relative risk determination for fracture. It provides a value for bone mass relative to the mean bone mass of a young adult reference population expressed in terms of standard deviation (SD).  Z-score is the age-matched score showing the patient's values compared to a population matched for age, sex, and race. This is also expressed in terms of standard deviation. The patient may have values that compare favorably to the age-matched values and still be at increased risk for fracture.   Electronically Signed   By: Lillia Mountain M.D.   On: 09/16/2013 13:04   Mm Digital Diagnostic Unilat L  09/21/2013   CLINICAL DATA:  Patient recalled from screening for left breast calcifications  EXAM: DIGITAL DIAGNOSTIC  LEFT MAMMOGRAM  COMPARISON:  [facility]  ACR Breast Density Category b: There  are scattered areas of fibroglandular density.  FINDINGS: There is a new 6 x 4 x 5 mm group of coarse heterogeneous calcifications within the upper-outer quadrant left breast middle depth.  IMPRESSION: New suspicious left breast calcifications.  RECOMMENDATION: Left breast stereotactic core needle biopsy. This will be performed same day, September 21, 2013  I have discussed the findings and recommendations with the patient. Results were also provided in writing at the conclusion of the visit. If applicable, a reminder letter will be sent to the patient regarding the next appointment.  BI-RADS CATEGORY  4: Suspicious abnormality - biopsy should be considered.   Electronically Signed   By: Lovey Newcomer M.D.   On: 09/21/2013 09:04   Mm Digital Screening Bilateral  09/09/2013   CLINICAL DATA:  Screening.  EXAM: DIGITAL SCREENING BILATERAL MAMMOGRAM WITH CAD  COMPARISON:  Previous Exam(s)  ACR Breast Density Category b: There are scattered areas of fibroglandular density.  FINDINGS: In the left breast, calcifications warrant further evaluation with magnified views. In the right breast, no findings suspicious for malignancy. Images were processed with CAD.  IMPRESSION: Further evaluation is suggested for calcifications in the left breast.  RECOMMENDATION: Diagnostic mammogram of the left breast. (Code:[id number]-58M)  The patient will be contacted regarding the findings, and additional imaging will be scheduled.  BI-RADS CATEGORY  0: Incomplete. Need additional imaging evaluation and/or prior mammograms for comparison.   Electronically Signed   By: Lillia Mountain M.D.   On: 09/09/2013 12:50   Mm Lt Breast Bx W Loc Dev 1st Lesion Image Bx Spec Stereo Guide  09/22/2013   ADDENDUM REPORT: 09/22/2013 14:11  ADDENDUM: I spoke with the patient on September 22, 2013 to relay pathology results from recent left breast stereotactic guided core needle biopsy. Pathology demonstrated ductal carcinoma in situ with necrosis and calcifications.  Additionally fibroadenoma and atypical lobular hyperplasia were demonstrated. Pathology results are concordant with imaging findings.  Surgical referral is recommended. The patient is scheduled for multidisciplinary breast conference  on 09/30/2013. Additionally the patient will be scheduled for a bilateral breast MRI at her convenience.  Patient states no complaints at the biopsy site.  Patient was instructed to call the breast center with any additional questions or concerns.   Electronically Signed   By: Lovey Newcomer M.D.   On: 09/22/2013 14:11   09/22/2013   CLINICAL DATA:  Suspicious left breast calcifications  EXAM: LEFT STEREOTACTIC CORE NEEDLE BIOPSY  COMPARISON:  Previous exams.  FINDINGS: The patient and I discussed the procedure of stereotactic-guided biopsy including benefits and alternatives. We discussed the high likelihood of a successful procedure. We discussed the risks of the procedure including infection, bleeding, tissue injury, clip migration, and inadequate sampling. Informed written consent was given. The usual time out protocol was performed immediately prior to the procedure.  Using sterile technique and 2% Lidocaine as local anesthetic, under stereotactic guidance, a 9 gauge vacuum assisted core needle biopsy device was used to perform core needle biopsy of calcifications within the upper-outer quadrant left breast using a lateral approach. Specimen radiograph was performed showing calcifications. Specimens with calcifications are identified for pathology.  At the conclusion of the procedure, an X shaped tissue marker clip was deployed into the biopsy cavity. Follow-up 2-view mammogram confirmed clip 6 mm medial to the biopsied calcifications. A few residual calcifications are identified on mammogram. .  IMPRESSION: Stereotactic-guided biopsy of left breast calcifications. No apparent complications.  Note biopsy marking clip is 6 mm medial to the biopsied calcifications. A few residual  calcifications are identified on mammogram.  Electronically Signed: By: Lovey Newcomer M.D. On: 09/21/2013 09:08     LABS:    Chemistry      Component Value Date/Time   NA 144 09/30/2013 1159   K 3.7 09/30/2013 1159   CO2 27 09/30/2013 1159   BUN 15.0 09/30/2013 1159   CREATININE 0.9 09/30/2013 1159      Component Value Date/Time   CALCIUM 9.8 09/30/2013 1159   ALKPHOS 74 09/30/2013 1159   AST 21 09/30/2013 1159   ALT 15 09/30/2013 1159   BILITOT 0.81 09/30/2013 1159      Lab Results  Component Value Date   WBC 8.4 09/30/2013   HGB 12.9 09/30/2013   HCT 39.6 09/30/2013   MCV 92.3 09/30/2013   PLT 309 09/30/2013     ASSESSMENT/PLAN   80 year old female with   1. Stage 0 (TisNx) high grade DCIS of the right/left breast found on screening mammogram. DCIS iThe DCIS was ER+/PR+   2. We spent the better part of today's hour-long appointment discussing the biology of breast cancer in general, and the specifics of the patient's tumor in particular. We discussed the multidisciplinary approach to breast cancer treatment. We went over her pathology. She understands that her cancer is a non invasive disease. She is now seen in the multidisciplinary breast clinic for discussion of treatment options. She is a good lumpectomy candidate followed by radiation and adjuvant antiestrogen therapy. We discussed the role of adjuvant anti-estrogen therapy to help prevent future ER+ breast cancer in the ipsilateral and contralateral breasts. We discussed the different drugs available including tamoxifen. Which would be 20 mg daily for a total of 5 years. We also discussed aromasin 25 mg daily for 5 years. We discussed the side effects of both medications.she will not begin anti-estrogen therapy until after completion of radiation therapy  3. patient will proceed with surgery first. Once she has had this then I will plan on  seeing her back. If her pathology is unchanged then she will certainly proceed with radiation  therapy. Followed by antiestrogen therapy. We discussed risks benefits and side effects of the antiestrogen therapy drugs. Definitive treatment plans will be made once patient returns after her surgery peer   Thank you so much for allowing me to participate in the care of Angelica Roberts I will continue to follow up the patient with you and assist in her care.  All questions were answered. The patient knows to call the clinic with any problems, questions or concerns. We can certainly see the patient much sooner if necessary.  I spent 30 minutes counseling the patient face to face. The total time spent in the appointment was 45 minutes.  Marcy Panning, MD Medical/Oncology [facility] [phone] (beeper) [phone] (Office)  09/30/2013, 1:28 PM

## 2013-09-30 NOTE — Progress Notes (Signed)
Checked in new pt with no financial concerns. °

## 2013-09-30 NOTE — Progress Notes (Signed)
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 female who is seen out of the courtesy of Dr. Fanny Skates as part of the multidisciplinary breast clinic. Earlier this year on routine screening mammography the patient was noted to have  suspicious calcifications within the upper outer quadrant of left breast. She ultimately underwent biopsy of this area which revealed ductal carcinoma in situ, high-grade associated with necrosis and calcifications.Marland Kitchen An MRI was performed which showed biopsy changes only in the upper-outer quadrant of the left breast. With this information the patient is now seen as part of the multidisciplinary breast clinic for evaluation.  PREVIOUS RADIATION THERAPY: No  PAST MEDICAL HISTORY:  has a past medical history of Carotid artery occlusion; Glaucoma; Postmenopausal HRT (hormone replacement therapy); GERD (gastroesophageal reflux disease); Osteoporosis; Allergy; Gait abnormality; Incontinence of bowel; Constipation; Esophageal hiatal hernia; Bladder disorder (June 2012); Kidney stone; Bone spur of right ankle; Weakness; Hearing impaired; Dyspnea on exertion; Arthritis; Back pain; Cancer (02/2004); and Anemia.  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 The patient did not receive any postoperative adjuvant treatment due to  Complications. She apparently had node positive disease at the time of her surgery  PAST SURGICAL HISTORY: Past Surgical History  Procedure Laterality Date  . Esopheageal cancer  02/2004    Removed   . Hernial repain  1980  . Hernia repair   1982    corrective surgery  . Incontinence surgery  june 2012  . Cholecystectomy  1995    Gall Bladder  . Abdominal hysterectomy  1997  . Exploration neck for postop hemorrhage / thrombosis / infection    . Foot surgery Right July 2013    Bone spur    FAMILY HISTORY: family history includes Breast cancer in her maternal uncle and sister; Cancer in her brother and brother; Diabetes in her father and mother; Heart attack in her maternal grandmother; Heart attack (age of onset: 74) in her mother; Hernia in her maternal grandmother and mother; Hyperlipidemia in her father; Hypertension in her father; Stroke in her sister; Stroke (age of onset: 68) in her mother and paternal grandmother; Stroke (age of onset: 12) in her father; Stroke (age of onset: 3) in her paternal grandfather.  SOCIAL HISTORY:  reports that she has never smoked. She has never used smokeless tobacco. She reports that she does not drink alcohol or use illicit drugs.  ALLERGIES: Shellfish allergy and Iodine  MEDICATIONS:  Current Outpatient Prescriptions  Medication Sig Dispense Refill  . b complex vitamins tablet Take 1 tablet by mouth daily.      . Biotin 1000 MCG tablet Take 1,000 mcg by mouth 3 (three) times daily. Hair, skin, nails      . calcium carbonate (OS-CAL) 600 MG TABS Take 600 mg by mouth 2 (two) times daily with a meal.      . Cholecalciferol (VITAMIN D3) 3000 UNITS TABS Take 2,000 mg by mouth.      . conjugated estrogens (PREMARIN) vaginal cream Place 1 Applicatorful vaginally every other day.      . denosumab (PROLIA) 60 MG/ML SOLN injection Inject 60  mg into the skin every 6 (six) months. Administer in upper arm, thigh, or abdomen      . diphenhydrAMINE (BENADRYL) 25 MG tablet Take 25 mg by mouth every 6 (six) hours as needed for itching.      . diphenhydrAMINE (SOMINEX) 25 MG tablet Take 25 mg by mouth at bedtime as needed for sleep.      Marland Kitchen esomeprazole (NEXIUM) 40 MG capsule Take 40 mg by mouth daily  before breakfast.      . estrogens, conjugated, (PREMARIN) 0.3 MG tablet Take 0.3 mg by mouth daily. Take daily for 21 days then do not take for 7 days.      . famotidine (PEPCID) 20 MG tablet Take 20 mg by mouth 2 (two) times daily.      . Loratadine (CLARITIN REDITABS) 5 MG TBDP Take by mouth.       No current facility-administered medications for this encounter.    REVIEW OF SYSTEMS:  A 15 point review of systems is documented in the electronic medical record. This was obtained by the nursing staff. However, I reviewed this with the patient to discuss relevant findings and make appropriate changes.  Prior to diagnosis the patient denied any pain in the breast area nipple discharge or bleeding. She denies any new bony pain headaches dizziness or blurred vision. She has chronic problems with reflux   PHYSICAL EXAM:   Vitals - 1 value per visit [id number]  SYSTOLIC [id number]  DIASTOLIC 72  Pulse 74  Temperature 98  Respirations 18  Weight (lb) 143.4  Height 5\' 2"   BMI 26.22  VISIT REPORT    Gen. this is a very pleasant healthy-appearing 80 year old female in no acute distress. She is accompanied by her 2 daughters on evaluation today. Examination of the neck and supraclavicular region reveals no evidence of adenopathy. The axillary areas are free of adenopathy. Examination of the lungs with some to be clear. The heart has a regular rhythm and rate. The abdomen is soft and nontender with normal bowel sounds. Scars are seen along the abdominal region from her prior esophageal surgery. Examination of the right breast reveals no mass or nipple discharge. Examination of the left breast reveals small biopsy site in the upper outer quadrant of the left breast. No dominant masses appreciated in the breast nipple discharge or bleeding.   ECOG = 0  0 - Asymptomatic (Fully active, able to carry on all predisease activities without restriction)   LABORATORY DATA:  Lab Results  Component Value Date    WBC 8.4 09/30/2013   HGB 12.9 09/30/2013   HCT 39.6 09/30/2013   MCV 92.3 09/30/2013   PLT 309 09/30/2013   NEUTROABS 5.6 09/30/2013   Lab Results  Component Value Date   NA 144 09/30/2013   K 3.7 09/30/2013   CO2 27 09/30/2013   GLUCOSE 105 09/30/2013   CREATININE 0.9 09/30/2013   CALCIUM 9.8 09/30/2013      RADIOGRAPHY: Mr Breast Bilateral W Wo Contrast  09/28/2013   CLINICAL DATA:  80 year old female with biopsy proven ductal carcinoma in situ with necrosis and calcifications from the upper-outer quadrant of the left breast.  LABS:  BUN and creatinine were obtained on site at [facility] at  [address]  Results:  BUN 11 mg/dL,  Creatinine 0.7 mg/dL.  EXAM: BILATERAL BREAST MRI WITH AND WITHOUT CONTRAST  TECHNIQUE: Multiplanar, multisequence MR images of both breasts were obtained prior to and following the intravenous administration of 65ml of  MultiHance.  THREE-DIMENSIONAL MR IMAGE RENDERING ON INDEPENDENT WORKSTATION:  Three-dimensional MR images were rendered by post-processing of the original MR data on an independent workstation. The three-dimensional MR images were interpreted, and findings are reported in the following complete MRI report for this study. Three dimensional images were evaluated at the independent DynaCad workstation  COMPARISON:  Previous exams  FINDINGS: Breast composition: b.  Scattered fibroglandular tissue.  Background parenchymal enhancement: Moderate  Right breast: No mass or abnormal enhancement.  Left breast: Stereotactic biopsy was performed from a lateral approach. Biopsy changes are seen in the upper-outer quadrant of the breast. Non masslike patchy enhancement measuring 2.7 cm is seen around the biopsy site. Enhancement is likely secondary to biopsy changes. No additional areas of abnormal enhancement seen in left breast.  Lymph nodes: No abnormal appearing lymph nodes.  Ancillary findings:  None.  IMPRESSION: Post biopsy changes in the upper-outer  quadrant of the left breast corresponding to the area of the known ductal carcinoma in-situ.  RECOMMENDATION: Treatment planning of the known left breast ductal carcinoma in situ is recommended.  BI-RADS CATEGORY  6: Known biopsy-proven malignancy - appropriate action should be taken.   Electronically Signed   By: Lillia Mountain M.D.   On: 09/28/2013 13:12   Dg Bone Density  09/16/2013   CLINICAL DATA:  Postmenopausal.  EXAM: DUAL X-RAY ABSORPTIOMETRY (DXA) FOR BONE MINERAL DENSITY  FINDINGS: LEFT FEMUR NECK  Bone Mineral Density (BMD):  0.607 g/cm2  Young Adult T-Score: -2.2  Z-Score:  0.0  LEFT  FOREARM (1/3 RADIUS)  Bone Mineral Density (BMD):  0.542  Young Adult T Score:  -2.5  Z Score:  0.3  ASSESSMENT: Patient's diagnostic category is OSTEOPOROSIS by WHO Criteria.  FRACTURE RISK: INCREASED  FRAX: World Health Organization FRAX assessment of absolute fracture risk is not calculated for this patient because the patient has osteoporosis.  COMPARISON: The bone mineral density of the lumbar spine has increased by 8.8% compared to the 09/10/2008 study. The bone mineral density of the left forearm has not significantly changed compared to the 07/04/2011 study. For additional comparisons please see the mailed report.  Effective therapies are available in the form of bisphosphonates, selective estrogen receptor modulators, biologic agents, and hormone replacement therapy (for women). All patients should ensure an adequate intake of dietary calcium (1200 mg daily) and vitamin D (800 IU daily) unless contraindicated.  All treatment decisions require clinical judgment and consideration of individual patient factors, including patient preferences, co-morbidities, previous drug use, risk factors not captured in the FRAX model (e.g., frailty, falls, vitamin D deficiency, increased bone turnover, interval significant decline in bone density) and possible under- or over-estimation of fracture risk by FRAX.  The National  Osteoporosis Foundation recommends that FDA-approved medical therapies be considered in postmenopausal women and men age 27 or older with a:  1. Hip or vertebral (clinical or morphometric) fracture.  2. T-score of -2.5 or lower at the spine or hip.  3. Ten-year fracture probability by FRAX of 3% or greater for hip fracture or 20% or greater for major osteoporotic fracture.  People with diagnosed cases of osteoporosis or at high risk for fracture should have regular bone mineral density tests. For patients eligible for Medicare, routine testing is allowed once every 2 years. The testing frequency can be increased to one year for patients who have rapidly progressing disease, those who are receiving or discontinuing medical therapy to restore bone mass, or have additional risk factors.  World Pharmacologist [facility])  Criteria:  Normal: T-scores from +1.0 to -1.0  Low Bone Mass (Osteopenia): T-scores between -1.0 and -2.5  Osteoporosis: T-scores -2.5 and below  Comparison to Reference Population:  T-score is the key measure used in the diagnosis of osteoporosis and relative risk determination for fracture. It provides a value for bone mass relative to the mean bone mass of a young adult reference population expressed in terms of standard deviation (SD).  Z-score is the age-matched score showing the patient's values compared to a population matched for age, sex, and race. This is also expressed in terms of standard deviation. The patient may have values that compare favorably to the age-matched values and still be at increased risk for fracture.   Electronically Signed   By: Lillia Mountain M.D.   On: 09/16/2013 13:04   Mm Digital Diagnostic Unilat L  09/21/2013   CLINICAL DATA:  Patient recalled from screening for left breast calcifications  EXAM: DIGITAL DIAGNOSTIC  LEFT MAMMOGRAM  COMPARISON:  [facility]  ACR Breast Density Category b: There are scattered areas of fibroglandular density.  FINDINGS: There is a new 6 x 4 x  5 mm group of coarse heterogeneous calcifications within the upper-outer quadrant left breast middle depth.  IMPRESSION: New suspicious left breast calcifications.  RECOMMENDATION: Left breast stereotactic core needle biopsy. This will be performed same day, September 21, 2013  I have discussed the findings and recommendations with the patient. Results were also provided in writing at the conclusion of the visit. If applicable, a reminder letter will be sent to the patient regarding the next appointment.  BI-RADS CATEGORY  4: Suspicious abnormality - biopsy should be considered.   Electronically Signed   By: Lovey Newcomer M.D.   On: 09/21/2013 09:04   Mm Digital Screening Bilateral  09/09/2013   CLINICAL DATA:  Screening.  EXAM: DIGITAL SCREENING BILATERAL MAMMOGRAM WITH CAD  COMPARISON:  Previous Exam(s)  ACR Breast Density Category b: There are scattered areas of fibroglandular density.  FINDINGS: In the left breast, calcifications warrant further evaluation with magnified views. In the right breast, no findings suspicious for malignancy. Images were processed with CAD.  IMPRESSION: Further evaluation is suggested for calcifications in the left breast.  RECOMMENDATION: Diagnostic mammogram of the left breast. (Code:FI-L-24M)  The patient will be contacted regarding the findings, and additional imaging will be scheduled.  BI-RADS CATEGORY  0: Incomplete. Need additional imaging evaluation and/or prior mammograms for comparison.   Electronically Signed   By: Lillia Mountain M.D.   On: 09/09/2013 12:50   Mm Lt Breast Bx W Loc Dev 1st Lesion Image Bx Spec Stereo Guide  09/22/2013   ADDENDUM REPORT: 09/22/2013 14:11  ADDENDUM: I spoke with the patient on September 22, 2013 to relay pathology results from recent left breast stereotactic guided core needle biopsy. Pathology demonstrated ductal carcinoma in situ with necrosis and calcifications. Additionally fibroadenoma and atypical lobular hyperplasia were demonstrated. Pathology  results are concordant with imaging findings.  Surgical referral is recommended. The patient is scheduled for multidisciplinary breast conference on 09/30/2013. Additionally the patient will be scheduled for a bilateral breast MRI at her convenience.  Patient states no complaints at the biopsy site.  Patient was instructed to call the breast center with any additional questions or concerns.   Electronically Signed   By: Lovey Newcomer M.D.   On: 09/22/2013 14:11   09/22/2013   CLINICAL DATA:  Suspicious left breast calcifications  EXAM: LEFT STEREOTACTIC CORE NEEDLE BIOPSY  COMPARISON:  Previous  exams.  FINDINGS: The patient and I discussed the procedure of stereotactic-guided biopsy including benefits and alternatives. We discussed the high likelihood of a successful procedure. We discussed the risks of the procedure including infection, bleeding, tissue injury, clip migration, and inadequate sampling. Informed written consent was given. The usual time out protocol was performed immediately prior to the procedure.  Using sterile technique and 2% Lidocaine as local anesthetic, under stereotactic guidance, a 9 gauge vacuum assisted core needle biopsy device was used to perform core needle biopsy of calcifications within the upper-outer quadrant left breast using a lateral approach. Specimen radiograph was performed showing calcifications. Specimens with calcifications are identified for pathology.  At the conclusion of the procedure, an X shaped tissue marker clip was deployed into the biopsy cavity. Follow-up 2-view mammogram confirmed clip 6 mm medial to the biopsied calcifications. A few residual calcifications are identified on mammogram. .  IMPRESSION: Stereotactic-guided biopsy of left breast calcifications. No apparent complications.  Note biopsy marking clip is 6 mm medial to the biopsied calcifications. A few residual calcifications are identified on mammogram.  Electronically Signed: By: Lovey Newcomer M.D. On:  09/21/2013 09:08      IMPRESSION: High-grade intraductal carcinoma of the left breast. Patient would be a good candidate for breast conservation with partial mastectomy followed by adjuvant radiation therapy. I discussed with patient that she may be a potential candidate for adjuvant hormonal therapy alone given her age and tumor characteristics but at this point the patient seems to be leaning toward radiation therapy as part of her management.  She may be a candidate for accelerated hypo-fractionated course of treatment  PLAN: Patient will be seen in the postoperative setting for further evaluation and scheduling of  her anticipated radiation treatments.  Total time spent in this appointment was 60 minutes which included medical record review and evaluation, review of imaging studies, counseling and coordination of care.   ------------------------------------------------  Blair Promise, PhD, MD

## 2013-10-01 NOTE — Progress Notes (Signed)
[facility] Psychosocial Distress Screening Clinical Social Work  Clinical Social Work met with Patient and two daughters at breast clinic.  The patient scored a 1 on the Psychosocial Distress Thermometer which indicates mild distress. Clinical Social Worker Intern assessed for distress and other psychosocial needs. Patient was given written information about Patient and Family Support.  Patient is aware of services and agrees to seek out further assistance if needed.   Clinical Social Worker follow up needed: no  If yes, follow up plan:   Amy S. Poole Clinical Social Work Intern [facility] [phone]  

## 2013-10-06 ENCOUNTER — Encounter (INDEPENDENT_AMBULATORY_CARE_PROVIDER_SITE_OTHER): Payer: Self-pay | Admitting: General Surgery

## 2013-10-06 ENCOUNTER — Ambulatory Visit (INDEPENDENT_AMBULATORY_CARE_PROVIDER_SITE_OTHER): Payer: Medicare HMO | Admitting: General Surgery

## 2013-10-06 VITALS — BP 122/60 | Temp 98.9°F | Resp 18 | Ht 62.5 in | Wt 144.0 lb

## 2013-10-06 DIAGNOSIS — D0510 Intraductal carcinoma in situ of unspecified breast: Secondary | ICD-10-CM | POA: Insufficient documentation

## 2013-10-06 DIAGNOSIS — D0512 Intraductal carcinoma in situ of left breast: Secondary | ICD-10-CM

## 2013-10-06 DIAGNOSIS — D0590 Unspecified type of carcinoma in situ of unspecified breast: Secondary | ICD-10-CM

## 2013-10-06 NOTE — Progress Notes (Signed)
Patient ID: Angelica Roberts, female   DOB: [birth year], 80 y.o.   MRN: [id number]  Chief Complaint  Patient presents with  . Other    Eval left br cancer    HPI Angelica Roberts is a 80 y.o. female.  We're asked to see the patient in consultation by Dr. Humphrey Rolls to evaluate her for a left breast DCIS. The patient is a 80 year old white female who recently went for a routine screening mammogram. At that time she was found to have some abnormal calcifications in the upper outer left breast. It appears as though the calcifications measured only about 5 mm. She denied any breast pain. She denied any discharge from her nipple. 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 She was ER and PR positive. An MRI was done that showed an area of 2.7 cm that seemed to be postbiopsy change. She has been instructed to not take any female hormones and reduce her use of her Premarin vaginal cream to once a week. Her other significant history is that she had esophageal cancer about 10 years ago that was treated with photodynamic therapy as well as resection where her stomach was brought up through her chest for primary anastomosis. HPI  Past Medical History  Diagnosis Date  . Carotid artery occlusion     Bruit  . Glaucoma   . Postmenopausal HRT (hormone replacement therapy)   . GERD (gastroesophageal reflux disease)   . Osteoporosis   . Allergy     seasonal  . Gait abnormality   . Incontinence of bowel   . Constipation   . Esophageal hiatal hernia     upper  . Bladder disorder June 2012    Sling  . Kidney stone   . Bone spur of right ankle   . Weakness   . Hearing impaired   . Dyspnea on exertion   . Arthritis     osteoarthritis  . Back pain   . Cancer 02/2004    Esophageal   . Anemia     Past Surgical History  Procedure Laterality Date  . Esopheageal cancer  02/2004    Removed   . Hernial repain  1980  . Hernia repair  1982    corrective surgery  . Incontinence  surgery  june 2012  . Cholecystectomy  1995    Gall Bladder  . Exploration neck for postop hemorrhage / thrombosis / infection    . Foot surgery Right July 2013    Bone spur  . Vaginal hysterectomy  1977    Family History  Problem Relation Age of Onset  . Hernia Mother     Hiatal  . Diabetes Mother   . Heart attack Mother 24  . Stroke Mother 30  . Hypertension Father   . Stroke Father 54  . Diabetes Father   . Hyperlipidemia Father   . Stroke Sister   . Breast cancer Sister   . Cancer Brother     colon   . Heart attack Maternal Grandmother   . Hernia Maternal Grandmother   . Stroke Paternal Grandmother 69  . Stroke Paternal Grandfather 31  . Cancer Brother     Esophageal  . Breast cancer Maternal Uncle     Social History History  Substance Use Topics  . Smoking status: Never Smoker   . Smokeless tobacco: Never Used  . Alcohol Use: No    Allergies  Allergen Reactions  . Shellfish Allergy Diarrhea, Nausea Only and  Rash  . Iodine     Current Outpatient Prescriptions  Medication Sig Dispense Refill  . b complex vitamins tablet Take 1 tablet by mouth daily.      . Biotin 1000 MCG tablet Take 1,000 mcg by mouth 3 (three) times daily. Hair, skin, nails      . calcium carbonate (OS-CAL) 600 MG TABS Take 600 mg by mouth 2 (two) times daily with a meal.      . Cholecalciferol (VITAMIN D3) 3000 UNITS TABS Take 2,000 mg by mouth.      . conjugated estrogens (PREMARIN) vaginal cream Place 1 Applicatorful vaginally every other day.      . denosumab (PROLIA) 60 MG/ML SOLN injection Inject 60 mg into the skin every 6 (six) months. Administer in upper arm, thigh, or abdomen      . diphenhydrAMINE (BENADRYL) 25 MG tablet Take 25 mg by mouth every 6 (six) hours as needed for itching.      . esomeprazole (NEXIUM) 40 MG capsule Take 40 mg by mouth daily before breakfast.      . famotidine (PEPCID) 20 MG tablet Take 20 mg by mouth 2 (two) times daily.      . Loratadine (CLARITIN  REDITABS) 5 MG TBDP Take by mouth.      . diphenhydrAMINE (SOMINEX) 25 MG tablet Take 25 mg by mouth at bedtime as needed for sleep.       No current facility-administered medications for this visit.    Review of Systems Review of Systems  Constitutional: Negative.   HENT: Negative.   Eyes: Negative.   Respiratory: Negative.   Cardiovascular: Negative.   Gastrointestinal: Negative.   Endocrine: Negative.   Genitourinary: Negative.   Musculoskeletal: Negative.   Skin: Negative.   Allergic/Immunologic: Negative.   Neurological: Negative.   Hematological: Negative.   Psychiatric/Behavioral: Negative.     Blood pressure 122/60, temperature 98.9 F (37.2 C), temperature source Oral, resp. rate 18, height 5' 2.5" (1.588 m), weight 144 lb (65.318 kg).  Physical Exam Physical Exam  Constitutional: She is oriented to person, place, and time. She appears well-developed and well-nourished.  HENT:  Head: Normocephalic and atraumatic.  Eyes: Conjunctivae and EOM are normal. Pupils are equal, round, and reactive to light.  Neck: Normal range of motion. Neck supple.  Cardiovascular: Normal rate, regular rhythm and normal heart sounds.   Pulmonary/Chest: Effort normal and breath sounds normal.  There is no palpable mass in either breast. There is no palpable axillary, supraclavicular, or cervical lymphadenopathy  Abdominal: Soft. Bowel sounds are normal.  Musculoskeletal: Normal range of motion.  Lymphadenopathy:    She has no cervical adenopathy.  Neurological: She is alert and oriented to person, place, and time.  Skin: Skin is warm and dry.  Psychiatric: She has a normal mood and affect. Her behavior is normal.    Data Reviewed As above  Assessment    The patient appears to have a small area of DCIS in the upper outer left breast. I have talked her in detail about the different options for treatment. She favors breast conservation I think this is a very reasonable option for  her. Because this area of DCIS is very small and because of her age I think it would be reasonable not to have to check her lymph nodes. She will need a wire localized left breast lumpectomy. I've discussed with her in detail the risks and benefits of the operation to do this as well as some of the technical   aspects and she understands and wishes to proceed     Plan    Plan for left breast wire localized lumpectomy        Joan Avetisyan S Toth III 10/06/2013, 11:52 AM    

## 2013-10-06 NOTE — Patient Instructions (Signed)
Plan for left breast wire localized lumpectomy 

## 2013-10-07 ENCOUNTER — Encounter (HOSPITAL_BASED_OUTPATIENT_CLINIC_OR_DEPARTMENT_OTHER)
Admission: RE | Admit: 2013-10-07 | Discharge: 2013-10-07 | Disposition: A | Discharge status: Home / Self Care | Payer: Medicare HMO | Source: Ambulatory Visit | Attending: General Surgery | Admitting: General Surgery

## 2013-10-07 ENCOUNTER — Encounter (HOSPITAL_BASED_OUTPATIENT_CLINIC_OR_DEPARTMENT_OTHER): Payer: Self-pay | Admitting: *Deleted

## 2013-10-08 ENCOUNTER — Telehealth: Payer: Self-pay | Admitting: Oncology

## 2013-10-08 NOTE — Telephone Encounter (Signed)
called pt re appts for 5/27 and 6/11 but was not able to get a full message on pt's vm. schedule mailed.

## 2013-10-09 ENCOUNTER — Ambulatory Visit
Admission: RE | Admit: 2013-10-09 | Discharge: 2013-10-09 | Disposition: A | Discharge status: Home / Self Care | Payer: Medicare HMO | Source: Ambulatory Visit | Attending: General Surgery | Admitting: General Surgery

## 2013-10-09 ENCOUNTER — Encounter (HOSPITAL_BASED_OUTPATIENT_CLINIC_OR_DEPARTMENT_OTHER)
Admission: RE | Disposition: A | Discharge status: Home / Self Care | Payer: Self-pay | Source: Ambulatory Visit | Attending: General Surgery

## 2013-10-09 ENCOUNTER — Other Ambulatory Visit (INDEPENDENT_AMBULATORY_CARE_PROVIDER_SITE_OTHER): Payer: Self-pay | Admitting: General Surgery

## 2013-10-09 ENCOUNTER — Ambulatory Visit (HOSPITAL_BASED_OUTPATIENT_CLINIC_OR_DEPARTMENT_OTHER): Payer: Medicare HMO | Admitting: Anesthesiology

## 2013-10-09 ENCOUNTER — Encounter (HOSPITAL_BASED_OUTPATIENT_CLINIC_OR_DEPARTMENT_OTHER): Payer: Self-pay | Admitting: *Deleted

## 2013-10-09 ENCOUNTER — Encounter (HOSPITAL_BASED_OUTPATIENT_CLINIC_OR_DEPARTMENT_OTHER): Payer: Medicare HMO | Admitting: Anesthesiology

## 2013-10-09 ENCOUNTER — Ambulatory Visit (HOSPITAL_BASED_OUTPATIENT_CLINIC_OR_DEPARTMENT_OTHER)
Admission: RE | Admit: 2013-10-09 | Discharge: 2013-10-09 | Disposition: A | Discharge status: Home / Self Care | Payer: Medicare HMO | Source: Ambulatory Visit | Attending: General Surgery | Admitting: General Surgery

## 2013-10-09 DIAGNOSIS — K5900 Constipation, unspecified: Secondary | ICD-10-CM | POA: Insufficient documentation

## 2013-10-09 DIAGNOSIS — I6529 Occlusion and stenosis of unspecified carotid artery: Secondary | ICD-10-CM | POA: Insufficient documentation

## 2013-10-09 DIAGNOSIS — M1990 Unspecified osteoarthritis, unspecified site: Secondary | ICD-10-CM | POA: Insufficient documentation

## 2013-10-09 DIAGNOSIS — E7529 Other sphingolipidosis: Secondary | ICD-10-CM | POA: Insufficient documentation

## 2013-10-09 DIAGNOSIS — R32 Unspecified urinary incontinence: Secondary | ICD-10-CM | POA: Insufficient documentation

## 2013-10-09 DIAGNOSIS — D0512 Intraductal carcinoma in situ of left breast: Secondary | ICD-10-CM

## 2013-10-09 DIAGNOSIS — N6089 Other benign mammary dysplasias of unspecified breast: Secondary | ICD-10-CM

## 2013-10-09 DIAGNOSIS — G3180 Leukodystrophy, unspecified: Secondary | ICD-10-CM | POA: Insufficient documentation

## 2013-10-09 DIAGNOSIS — K449 Diaphragmatic hernia without obstruction or gangrene: Secondary | ICD-10-CM | POA: Insufficient documentation

## 2013-10-09 DIAGNOSIS — H9190 Unspecified hearing loss, unspecified ear: Secondary | ICD-10-CM | POA: Insufficient documentation

## 2013-10-09 DIAGNOSIS — K219 Gastro-esophageal reflux disease without esophagitis: Secondary | ICD-10-CM | POA: Insufficient documentation

## 2013-10-09 DIAGNOSIS — D0590 Unspecified type of carcinoma in situ of unspecified breast: Secondary | ICD-10-CM

## 2013-10-09 DIAGNOSIS — J301 Allergic rhinitis due to pollen: Secondary | ICD-10-CM | POA: Insufficient documentation

## 2013-10-09 DIAGNOSIS — D0510 Intraductal carcinoma in situ of unspecified breast: Secondary | ICD-10-CM

## 2013-10-09 DIAGNOSIS — H409 Unspecified glaucoma: Secondary | ICD-10-CM | POA: Insufficient documentation

## 2013-10-09 DIAGNOSIS — Z8501 Personal history of malignant neoplasm of esophagus: Secondary | ICD-10-CM | POA: Insufficient documentation

## 2013-10-09 DIAGNOSIS — Z01810 Encounter for preprocedural cardiovascular examination: Secondary | ICD-10-CM | POA: Insufficient documentation

## 2013-10-09 DIAGNOSIS — R269 Unspecified abnormalities of gait and mobility: Secondary | ICD-10-CM | POA: Insufficient documentation

## 2013-10-09 HISTORY — PX: BREAST LUMPECTOMY WITH NEEDLE LOCALIZATION: SHX5759

## 2013-10-09 HISTORY — DX: Presence of dental prosthetic device (complete) (partial): Z97.2

## 2013-10-09 HISTORY — PX: BREAST LUMPECTOMY: SHX2

## 2013-10-09 LAB — POCT HEMOGLOBIN-HEMACUE: Hemoglobin: 14.3 g/dL (ref 12.0–15.0)

## 2013-10-09 SURGERY — BREAST LUMPECTOMY WITH NEEDLE LOCALIZATION
Anesthesia: General | Site: Breast | Laterality: Left

## 2013-10-09 MED ORDER — OXYCODONE-ACETAMINOPHEN 5-325 MG PO TABS: 1.0000 | ORAL_TABLET | ORAL | Status: DC | PRN

## 2013-10-09 MED ORDER — HYDROMORPHONE HCL PF 1 MG/ML IJ SOLN
INTRAMUSCULAR | Status: AC
  Filled 2013-10-09: qty 1

## 2013-10-09 MED ORDER — DEXAMETHASONE SODIUM PHOSPHATE 4 MG/ML IJ SOLN
INTRAMUSCULAR | Status: DC | PRN
  Administered 2013-10-09: 10 mg via INTRAVENOUS

## 2013-10-09 MED ORDER — PROPOFOL 10 MG/ML IV BOLUS
INTRAVENOUS | Status: DC | PRN
Start: 2013-10-09 — End: 2013-10-09
  Administered 2013-10-09: 130 mg via INTRAVENOUS

## 2013-10-09 MED ORDER — BUPIVACAINE HCL (PF) 0.25 % IJ SOLN
INTRAMUSCULAR | Status: DC | PRN
  Administered 2013-10-09: 16.5 mL

## 2013-10-09 MED ORDER — FENTANYL CITRATE 0.05 MG/ML IJ SOLN: 50.0000 ug | INTRAMUSCULAR | Status: DC | PRN

## 2013-10-09 MED ORDER — FENTANYL CITRATE 0.05 MG/ML IJ SOLN
INTRAMUSCULAR | Status: DC | PRN
  Administered 2013-10-09: 50 ug via INTRAVENOUS
  Administered 2013-10-09 (×2): 25 ug via INTRAVENOUS

## 2013-10-09 MED ORDER — MIDAZOLAM HCL 2 MG/2ML IJ SOLN: 1.0000 mg | INTRAMUSCULAR | Status: DC | PRN

## 2013-10-09 MED ORDER — BUPIVACAINE HCL (PF) 0.25 % IJ SOLN
INTRAMUSCULAR | Status: AC
  Filled 2013-10-09: qty 30

## 2013-10-09 MED ORDER — CHLORHEXIDINE GLUCONATE 4 % EX LIQD: 1.0000 "application " | Freq: Once | CUTANEOUS | Status: DC

## 2013-10-09 MED ORDER — FENTANYL CITRATE 0.05 MG/ML IJ SOLN: 50.0000 ug | Freq: Once | INTRAMUSCULAR | Status: DC

## 2013-10-09 MED ORDER — ONDANSETRON HCL 4 MG/2ML IJ SOLN
INTRAMUSCULAR | Status: DC | PRN
  Administered 2013-10-09: 4 mg via INTRAVENOUS

## 2013-10-09 MED ORDER — CEFAZOLIN SODIUM-DEXTROSE 2-3 GM-% IV SOLR
2.0000 g | INTRAVENOUS | Status: AC
  Administered 2013-10-09: 2 g via INTRAVENOUS

## 2013-10-09 MED ORDER — OXYCODONE HCL 5 MG/5ML PO SOLN: 5.0000 mg | Freq: Once | ORAL | Status: AC | PRN

## 2013-10-09 MED ORDER — CEFAZOLIN SODIUM-DEXTROSE 2-3 GM-% IV SOLR
INTRAVENOUS | Status: AC
Start: 2013-10-09 — End: 2013-10-09
  Filled 2013-10-09: qty 50

## 2013-10-09 MED ORDER — LIDOCAINE HCL (CARDIAC) 20 MG/ML IV SOLN
INTRAVENOUS | Status: DC | PRN
  Administered 2013-10-09: 80 mg via INTRAVENOUS

## 2013-10-09 MED ORDER — OXYCODONE HCL 5 MG PO TABS
5.0000 mg | ORAL_TABLET | Freq: Once | ORAL | Status: AC | PRN
  Administered 2013-10-09: 5 mg via ORAL

## 2013-10-09 MED ORDER — HYDROMORPHONE HCL PF 1 MG/ML IJ SOLN
0.2500 mg | INTRAMUSCULAR | Status: DC | PRN
  Administered 2013-10-09: 0.5 mg via INTRAVENOUS

## 2013-10-09 MED ORDER — OXYCODONE HCL 5 MG PO TABS
ORAL_TABLET | ORAL | Status: AC
  Filled 2013-10-09: qty 1

## 2013-10-09 MED ORDER — FENTANYL CITRATE 0.05 MG/ML IJ SOLN
INTRAMUSCULAR | Status: AC
  Filled 2013-10-09: qty 4

## 2013-10-09 MED ORDER — LACTATED RINGERS IV SOLN
INTRAVENOUS | Status: DC
  Administered 2013-10-09: 12:00:00 via INTRAVENOUS

## 2013-10-09 MED ORDER — PROMETHAZINE HCL 25 MG/ML IJ SOLN: 6.2500 mg | INTRAMUSCULAR | Status: DC | PRN

## 2013-10-09 SURGICAL SUPPLY — 42 items
ADH SKN CLS APL DERMABOND .7 (GAUZE/BANDAGES/DRESSINGS) ×1
BLADE 15 SAFETY STRL DISP (BLADE) ×2 IMPLANT
CANISTER SUCT 1200ML W/VALVE (MISCELLANEOUS) ×2 IMPLANT
CHLORAPREP W/TINT 26ML (MISCELLANEOUS) ×2 IMPLANT
CLIP TI WIDE RED SMALL 6 (CLIP) ×2 IMPLANT
COVER MAYO STAND STRL (DRAPES) ×2 IMPLANT
COVER TABLE BACK 60X90 (DRAPES) ×2 IMPLANT
DECANTER SPIKE VIAL GLASS SM (MISCELLANEOUS) ×2 IMPLANT
DERMABOND ADVANCED (GAUZE/BANDAGES/DRESSINGS) ×1
DERMABOND ADVANCED .7 DNX12 (GAUZE/BANDAGES/DRESSINGS) ×1 IMPLANT
DEVICE DUBIN W/COMP PLATE 8390 (MISCELLANEOUS) ×2 IMPLANT
DRAPE LAPAROSCOPIC ABDOMINAL (DRAPES) ×2 IMPLANT
DRAPE UTILITY XL STRL (DRAPES) ×2 IMPLANT
ELECT COATED BLADE 2.86 ST (ELECTRODE) ×2 IMPLANT
ELECT REM PT RETURN 9FT ADLT (ELECTROSURGICAL) ×2
ELECTRODE REM PT RTRN 9FT ADLT (ELECTROSURGICAL) ×1 IMPLANT
GLOVE BIO SURGEON STRL SZ 6.5 (GLOVE) ×1 IMPLANT
GLOVE BIO SURGEON STRL SZ7.5 (GLOVE) ×2 IMPLANT
GLOVE BIOGEL M 7.0 STRL (GLOVE) ×1 IMPLANT
GLOVE BIOGEL PI IND STRL 7.5 (GLOVE) IMPLANT
GLOVE BIOGEL PI INDICATOR 7.5 (GLOVE) ×1
GOWN STRL REUS W/ TWL LRG LVL3 (GOWN DISPOSABLE) ×2 IMPLANT
GOWN STRL REUS W/TWL LRG LVL3 (GOWN DISPOSABLE) ×4
KIT MARKER MARGIN INK (KITS) ×1 IMPLANT
NDL HYPO 25X1 1.5 SAFETY (NEEDLE) ×1 IMPLANT
NEEDLE HYPO 25X1 1.5 SAFETY (NEEDLE) ×2 IMPLANT
NS IRRIG 1000ML POUR BTL (IV SOLUTION) ×2 IMPLANT
PACK BASIN DAY SURGERY FS (CUSTOM PROCEDURE TRAY) ×2 IMPLANT
PENCIL BUTTON HOLSTER BLD 10FT (ELECTRODE) ×2 IMPLANT
SLEEVE SCD COMPRESS KNEE MED (MISCELLANEOUS) ×2 IMPLANT
SPONGE LAP 18X18 X RAY DECT (DISPOSABLE) ×2 IMPLANT
STAPLER VISISTAT 35W (STAPLE) IMPLANT
SUT MON AB 4-0 PC3 18 (SUTURE) ×2 IMPLANT
SUT SILK 2 0 SH (SUTURE) ×2 IMPLANT
SUT VIC AB 3-0 54X BRD REEL (SUTURE) IMPLANT
SUT VIC AB 3-0 BRD 54 (SUTURE)
SUT VICRYL 3-0 CR8 SH (SUTURE) ×2 IMPLANT
SYR CONTROL 10ML LL (SYRINGE) ×2 IMPLANT
TOWEL OR 17X24 6PK STRL BLUE (TOWEL DISPOSABLE) ×2 IMPLANT
TOWEL OR NON WOVEN STRL DISP B (DISPOSABLE) ×2 IMPLANT
TUBE CONNECTING 20X1/4 (TUBING) ×2 IMPLANT
YANKAUER SUCT BULB TIP NO VENT (SUCTIONS) ×2 IMPLANT

## 2013-10-09 NOTE — Anesthesia Preprocedure Evaluation (Signed)
Anesthesia Evaluation  Patient identified by MRN, date of birth, ID band Patient awake    Reviewed: Allergy & Precautions, H&P , NPO status , Patient's Chart, lab work & pertinent test results  Airway Mallampati: II TM Distance: <3 FB Neck ROM: Full    Dental   Pulmonary  breath sounds clear to auscultation        Cardiovascular Rhythm:Regular Rate:Normal  Carotid occlusion hx   Neuro/Psych    GI/Hepatic GERD-  ,  Endo/Other    Renal/GU      Musculoskeletal   Abdominal   Peds  Hematology   Anesthesia Other Findings Breast ca  Reproductive/Obstetrics                           Anesthesia Physical Anesthesia Plan  ASA: II  Anesthesia Plan: General   Post-op Pain Management:    Induction: Intravenous  Airway Management Planned: LMA  Additional Equipment:   Intra-op Plan:   Post-operative Plan: Extubation in OR  Informed Consent: I have reviewed the patients History and Physical, chart, labs and discussed the procedure including the risks, benefits and alternatives for the proposed anesthesia with the patient or authorized representative who has indicated his/her understanding and acceptance.     Plan Discussed with: CRNA and Surgeon  Anesthesia Plan Comments:         Anesthesia Quick Evaluation

## 2013-10-09 NOTE — Op Note (Signed)
10/09/2013  3:19 PM  PATIENT:  Angelica Roberts  80 y.o. female  PRE-OPERATIVE DIAGNOSIS:  left breast DCIS   POST-OPERATIVE DIAGNOSIS:  left breast DCIS   PROCEDURE:  Procedure(s): BREAST LUMPECTOMY WITH NEEDLE LOCALIZATION (Left)  SURGEON:  Surgeon(s) and Role:    * Merrie Roof, MD - Primary  PHYSICIAN ASSISTANT:   ASSISTANTS: none   ANESTHESIA:   general  EBL:  Total I/O In: 1800 [I.V.:1800] Out: -   BLOOD ADMINISTERED:none  DRAINS: none   LOCAL MEDICATIONS USED:  MARCAINE     SPECIMEN:  Source of Specimen:  left breast tissue  DISPOSITION OF SPECIMEN:  PATHOLOGY  COUNTS:  YES  TOURNIQUET:  * No tourniquets in log *  DICTATION: .Dragon Dictation After informed consent was obtained the patient was brought to the operating room and placed in the supine position on the operating room table. After adequate induction of general anesthesia the patient's left chest and breast area were prepped with ChloraPrep, allowed to dry, and draped in usual sterile manner. Earlier in the day the patient underwent a wire localization procedure and the wire was entering the left breast in the upper outer quadrant and headed medially. A transversely oriented incision was made in the upper outer quadrant with a 15 blade knife overlying the path of the wire. This incision was carried through skin and subcutaneous tissue sharply with the electrocautery. Once into the breast tissue the path of the wire could be palpated. A circular portion of breast tissue was excised sharply around the path of the wire with the electrocautery. This dissection was carried all the way to the chest wall. Once the specimen was removed it was oriented according to the appropriate paint colors. A specimen radiograph was obtained that showed the clip in the center of the specimen. The specimen was then sent to pathology for further evaluation. Hemostasis was achieved using the Bovie electrocautery. The wound was  irrigated with copious amounts of saline and infiltrated with Marcaine. The deeper layer of the wound was then closed with interrupted 3-0 Vicryl stitches. The skin was then closed with interrupted 4-0 Monocryl subcuticular stitches. Dermabond dressings were applied. The patient tolerated the procedure well. At the end of the case all needle sponge and instrument counts were correct. The patient was then awakened and taken to recovery in stable condition.  PLAN OF CARE: Discharge to home after PACU  PATIENT DISPOSITION:  PACU - hemodynamically stable.   Delay start of Pharmacological VTE agent (>24hrs) due to surgical blood loss or risk of bleeding: not applicable

## 2013-10-09 NOTE — H&P (View-Only) (Signed)
Patient ID: Angelica Roberts, female   DOB: [birth year], 80 y.o.   MRN: [id number]  Chief Complaint  Patient presents with  . Other    Eval left br cancer    HPI Angelica Roberts is a 80 y.o. female.  We're asked to see the patient in consultation by Dr. Humphrey Roberts to evaluate her for a left breast DCIS. The patient is a 80 year old white female who recently went for a routine screening mammogram. At that time she was found to have some abnormal calcifications in the upper outer left breast. It appears as though the calcifications measured only about 5 mm. She denied any breast pain. She denied any discharge from her nipple. 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 She was ER and PR positive. An MRI was done that showed an area of 2.7 cm that seemed to be postbiopsy change. She has been instructed to not take any female hormones and reduce her use of her Premarin vaginal cream to once a week. Her other significant history is that she had esophageal cancer about 10 years ago that was treated with photodynamic therapy as well as resection where her stomach was brought up through her chest for primary anastomosis. HPI  Past Medical History  Diagnosis Date  . Carotid artery occlusion     Bruit  . Glaucoma   . Postmenopausal HRT (hormone replacement therapy)   . GERD (gastroesophageal reflux disease)   . Osteoporosis   . Allergy     seasonal  . Gait abnormality   . Incontinence of bowel   . Constipation   . Esophageal hiatal hernia     upper  . Bladder disorder June 2012    Sling  . Kidney stone   . Bone spur of right ankle   . Weakness   . Hearing impaired   . Dyspnea on exertion   . Arthritis     osteoarthritis  . Back pain   . Cancer 02/2004    Esophageal   . Anemia     Past Surgical History  Procedure Laterality Date  . Esopheageal cancer  02/2004    Removed   . Hernial repain  1980  . Hernia repair  1982    corrective surgery  . Incontinence  surgery  june 2012  . Cholecystectomy  1995    Gall Bladder  . Exploration neck for postop hemorrhage / thrombosis / infection    . Foot surgery Right July 2013    Bone spur  . Vaginal hysterectomy  1977    Family History  Problem Relation Age of Onset  . Hernia Mother     Hiatal  . Diabetes Mother   . Heart attack Mother 24  . Stroke Mother 30  . Hypertension Father   . Stroke Father 54  . Diabetes Father   . Hyperlipidemia Father   . Stroke Sister   . Breast cancer Sister   . Cancer Brother     colon   . Heart attack Maternal Grandmother   . Hernia Maternal Grandmother   . Stroke Paternal Grandmother 69  . Stroke Paternal Grandfather 31  . Cancer Brother     Esophageal  . Breast cancer Maternal Uncle     Social History History  Substance Use Topics  . Smoking status: Never Smoker   . Smokeless tobacco: Never Used  . Alcohol Use: No    Allergies  Allergen Reactions  . Shellfish Allergy Diarrhea, Nausea Only and  Rash  . Iodine     Current Outpatient Prescriptions  Medication Sig Dispense Refill  . b complex vitamins tablet Take 1 tablet by mouth daily.      . Biotin 1000 MCG tablet Take 1,000 mcg by mouth 3 (three) times daily. Hair, skin, nails      . calcium carbonate (OS-CAL) 600 MG TABS Take 600 mg by mouth 2 (two) times daily with a meal.      . Cholecalciferol (VITAMIN D3) 3000 UNITS TABS Take 2,000 mg by mouth.      . conjugated estrogens (PREMARIN) vaginal cream Place 1 Applicatorful vaginally every other day.      . denosumab (PROLIA) 60 MG/ML SOLN injection Inject 60 mg into the skin every 6 (six) months. Administer in upper arm, thigh, or abdomen      . diphenhydrAMINE (BENADRYL) 25 MG tablet Take 25 mg by mouth every 6 (six) hours as needed for itching.      . esomeprazole (NEXIUM) 40 MG capsule Take 40 mg by mouth daily before breakfast.      . famotidine (PEPCID) 20 MG tablet Take 20 mg by mouth 2 (two) times daily.      . Loratadine (CLARITIN  REDITABS) 5 MG TBDP Take by mouth.      . diphenhydrAMINE (SOMINEX) 25 MG tablet Take 25 mg by mouth at bedtime as needed for sleep.       No current facility-administered medications for this visit.    Review of Systems Review of Systems  Constitutional: Negative.   HENT: Negative.   Eyes: Negative.   Respiratory: Negative.   Cardiovascular: Negative.   Gastrointestinal: Negative.   Endocrine: Negative.   Genitourinary: Negative.   Musculoskeletal: Negative.   Skin: Negative.   Allergic/Immunologic: Negative.   Neurological: Negative.   Hematological: Negative.   Psychiatric/Behavioral: Negative.     Blood pressure 122/60, temperature 98.9 F (37.2 C), temperature source Oral, resp. rate 18, height 5' 2.5" (1.588 m), weight 144 lb (65.318 kg).  Physical Exam Physical Exam  Constitutional: She is oriented to person, place, and time. She appears well-developed and well-nourished.  HENT:  Head: Normocephalic and atraumatic.  Eyes: Conjunctivae and EOM are normal. Pupils are equal, round, and reactive to light.  Neck: Normal range of motion. Neck supple.  Cardiovascular: Normal rate, regular rhythm and normal heart sounds.   Pulmonary/Chest: Effort normal and breath sounds normal.  There is no palpable mass in either breast. There is no palpable axillary, supraclavicular, or cervical lymphadenopathy  Abdominal: Soft. Bowel sounds are normal.  Musculoskeletal: Normal range of motion.  Lymphadenopathy:    She has no cervical adenopathy.  Neurological: She is alert and oriented to person, place, and time.  Skin: Skin is warm and dry.  Psychiatric: She has a normal mood and affect. Her behavior is normal.    Data Reviewed As above  Assessment    The patient appears to have a small area of DCIS in the upper outer left breast. I have talked her in detail about the different options for treatment. She favors breast conservation I think this is a very reasonable option for  her. Because this area of DCIS is very small and because of her age I think it would be reasonable not to have to check her lymph nodes. She will need a wire localized left breast lumpectomy. I've discussed with her in detail the risks and benefits of the operation to do this as well as some of the technical  aspects and she understands and wishes to proceed     Plan    Plan for left breast wire localized lumpectomy        Luella Cook III 10/06/2013, 11:52 AM

## 2013-10-09 NOTE — Anesthesia Postprocedure Evaluation (Signed)
  Anesthesia Post-op Note  Patient: Angelica Roberts  Procedure(s) Performed: Procedure(s): BREAST LUMPECTOMY WITH NEEDLE LOCALIZATION (Left)  Patient Location: PACU  Anesthesia Type:General  Level of Consciousness: awake  Airway and Oxygen Therapy: Patient Spontanous Breathing  Post-op Pain: mild  Post-op Assessment: Post-op Vital signs reviewed, Patient's Cardiovascular Status Stable, Respiratory Function Stable, Patent Airway, No signs of Nausea or vomiting and Pain level controlled  Post-op Vital Signs: Reviewed and stable  Last Vitals:  Filed Vitals:   10/09/13 1640  BP: 134/66  Pulse: 74  Temp: 36.5 C  Resp: 16    Complications: No apparent anesthesia complications

## 2013-10-09 NOTE — Discharge Instructions (Signed)

## 2013-10-09 NOTE — Anesthesia Procedure Notes (Signed)
Procedure Name: LMA Insertion Performed by: Haniah Penny W Pre-anesthesia Checklist: Patient identified, Timeout performed, Emergency Drugs available, Suction available and Patient being monitored Patient Re-evaluated:Patient Re-evaluated prior to inductionOxygen Delivery Method: Circle system utilized Preoxygenation: Pre-oxygenation with 100% oxygen Intubation Type: IV induction Ventilation: Mask ventilation without difficulty LMA: LMA inserted LMA Size: 4.0 Number of attempts: 1 Placement Confirmation: positive ETCO2 Tube secured with: Tape Dental Injury: Teeth and Oropharynx as per pre-operative assessment      

## 2013-10-09 NOTE — Interval H&P Note (Signed)
History and Physical Interval Note:  10/09/2013 12:00 PM  Angelica Roberts  has presented today for surgery, with the diagnosis of left breast DCIS   The various methods of treatment have been discussed with the patient and family. After consideration of risks, benefits and other options for treatment, the patient has consented to  Procedure(s): BREAST LUMPECTOMY WITH NEEDLE LOCALIZATION (Left) as a surgical intervention .  The patient's history has been reviewed, patient examined, no change in status, stable for surgery.  I have reviewed the patient's chart and labs.  Questions were answered to the patient's satisfaction.     Luella Cook III

## 2013-10-09 NOTE — Transfer of Care (Signed)
Immediate Anesthesia Transfer of Care Note  Patient: Angelica Roberts  Procedure(s) Performed: Procedure(s): BREAST LUMPECTOMY WITH NEEDLE LOCALIZATION (Left)  Patient Location: PACU  Anesthesia Type:General  Level of Consciousness: awake and alert   Airway & Oxygen Therapy: Patient Spontanous Breathing and Patient connected to face mask oxygen  Post-op Assessment: Report given to PACU RN and Post -op Vital signs reviewed and stable  Post vital signs: Reviewed and stable  Complications: No apparent anesthesia complications

## 2013-10-12 ENCOUNTER — Encounter (HOSPITAL_BASED_OUTPATIENT_CLINIC_OR_DEPARTMENT_OTHER): Payer: Self-pay | Admitting: General Surgery

## 2013-10-16 ENCOUNTER — Telehealth: Payer: Self-pay | Admitting: *Deleted

## 2013-10-16 ENCOUNTER — Encounter: Payer: Self-pay | Admitting: Radiation Oncology

## 2013-10-16 NOTE — Progress Notes (Signed)
Location of Breast Cancer: left breast DCIS  Histology per Pathology Report:   09/21/13 Diagnosis Breast, left, needle core biopsy, upper outer quadrant - DUCTAL CARCINOMA IN SITU WITH NECROSIS AND CALCIFICATIONS. - FIBROCYSTIC CHANGES. - FIBROADENOMA. - ATYPICAL LOBULAR HYPERPLASIA (LOBULAR NEOPLASIA).  10/09/13 Diagnosis Breast, lumpectomy, Left - LOBULAR NEOPLASIA (ATYPICAL LOBULAR HYPERPLASIA), SEE COMMENT. - PREVIOUS BIOPSY SITE. - SURGICAL MARGINS, NEGATIVE FOR ATYPIA OR MALIGNANCY. - SEE TUMOR SYNOPTIC TEMPLATE BELOW. Microscopic Comment BREAST, IN SITU CARCINOMA  Receptor Status: ER(100% positive), PR (15% positive), Her2-neu (not done)  Did patient present with symptoms (if so, please note symptoms) or was this found on screening mammography?: screen mammogram  Past/Anticipated interventions by surgeon, if any: 10/09/13 Procedure: BREAST LUMPECTOMY WITH NEEDLE LOCALIZATION;  Surgeon: Merrie Roof, MD;  Location: [facility];  Service: General;  Laterality: Left;  Past/Anticipated interventions by medical oncology, if any: antiestrogen therapy.   Lymphedema issues, if any: no   Pain issues, if any:  no   SAFETY ISSUES:  Prior radiation? no  Pacemaker/ICD? no  Possible current pregnancy?no  Is the patient on methotrexate? no  Current Complaints / other details:  Had esophageal cancer in 2005.  Menarche at age 58 she is postmenopausal she stopped hormone replacement therapy in 2005 first live birth at 75  Has had a dry cough for the last 10 days.  Says she has reflux from her esophageal surgery.      Jacqulyn Liner, RN 10/16/2013,1:32 PM

## 2013-10-16 NOTE — Telephone Encounter (Signed)
Called and spoke with patient and informed her that Dr. Humphrey Rolls is on an unexpected leave of absence.  Confirmed new appointment for 12/22/13 at 130 for labs and 2pm with Dr.Magrinat.

## 2013-10-22 ENCOUNTER — Ambulatory Visit
Admission: RE | Admit: 2013-10-22 | Discharge: 2013-10-22 | Disposition: A | Discharge status: Home / Self Care | Payer: Medicare HMO | Source: Ambulatory Visit | Attending: Radiation Oncology | Admitting: Radiation Oncology

## 2013-10-22 ENCOUNTER — Encounter: Payer: Self-pay | Admitting: Radiation Oncology

## 2013-10-22 VITALS — BP 117/62 | HR 70 | Temp 98.1°F | Ht 62.0 in | Wt 143.9 lb

## 2013-10-22 DIAGNOSIS — Z79899 Other long term (current) drug therapy: Secondary | ICD-10-CM | POA: Insufficient documentation

## 2013-10-22 DIAGNOSIS — C50412 Malignant neoplasm of upper-outer quadrant of left female breast: Secondary | ICD-10-CM

## 2013-10-22 DIAGNOSIS — L255 Unspecified contact dermatitis due to plants, except food: Secondary | ICD-10-CM | POA: Insufficient documentation

## 2013-10-22 DIAGNOSIS — Z91013 Allergy to seafood: Secondary | ICD-10-CM | POA: Insufficient documentation

## 2013-10-22 DIAGNOSIS — C50419 Malignant neoplasm of upper-outer quadrant of unspecified female breast: Secondary | ICD-10-CM | POA: Insufficient documentation

## 2013-10-22 DIAGNOSIS — Z510 Encounter for antineoplastic radiation therapy: Secondary | ICD-10-CM | POA: Insufficient documentation

## 2013-10-22 HISTORY — DX: Intraductal carcinoma in situ of unspecified breast: D05.10

## 2013-10-22 HISTORY — DX: Other enthesopathy of unspecified foot and ankle: M77.50

## 2013-10-22 NOTE — Progress Notes (Signed)
Please see the Nurse Progress Note in the MD Initial Consult Encounter for this patient. 

## 2013-10-22 NOTE — Progress Notes (Signed)
Radiation Oncology         [phone] ________________________________  Name: KINDELL STRADA MRN: [id number]  Date: 10/22/2013  DOB: [birth year]  Reevaluation Note  CC: [name], MD  Merrie Roof, MD    Diagnosis:   High-grade intraductal carcinoma of the left breast  Breast cancer of upper-outer quadrant of left female breast   Primary site: Breast (Left)   Staging method: AJCC 7th Edition   Clinical: Stage 0 (Tis (DCIS), NX, cM0)    Summary: Stage 0 (Tis (DCIS), NX, cM0)   Narrative:  The patient returns today for further evaluation. Patient was initially seen in the multidisciplinary breast clinic on April 15. Since that time the patient has undergone her definitive surgery with a needle guided lumpectomy under the direction of Dr. Marlou Starks. She has been doing well since her surgery. Her lumpectomy specimen showed lobular neoplasia but no residual intraductal breast cancer or invasive breast cancer. The surgical margins were negative for atypia or malignancy.                               ALLERGIES:  is allergic to shellfish allergy and iodine.  Meds: Current Outpatient Prescriptions  Medication Sig Dispense Refill  . b complex vitamins tablet Take 1 tablet by mouth daily.      . Biotin 1000 MCG tablet Take 1,000 mcg by mouth 3 (three) times daily. Hair, skin, nails      . calcium carbonate (OS-CAL) 600 MG TABS Take 333 mg by mouth 2 (two) times daily with a meal.       . Cholecalciferol (VITAMIN D3) 3000 UNITS TABS Take 1,000 mg by mouth. Take 1000 IU daily      . denosumab (PROLIA) 60 MG/ML SOLN injection Inject 60 mg into the skin every 6 (six) months. Administer in upper arm, thigh, or abdomen      . esomeprazole (NEXIUM) 40 MG capsule Take 22.3 mg by mouth once.       . famotidine (PEPCID) 20 MG tablet Take 20 mg by mouth 2 (two) times daily.      Marland Kitchen ibuprofen (ADVIL,MOTRIN) 100 MG tablet Take 100 mg by mouth every 6 (six) hours as needed for fever.      . Loratadine  (CLARITIN REDITABS) 5 MG TBDP Take 10 mg by mouth daily.        No current facility-administered medications for this encounter.    Physical Findings: The patient is in no acute distress. Patient is alert and oriented.  height is 5' 2"  (1.575 m) and weight is 143 lb 14.4 oz (65.273 kg). Her temperature is 98.1 F (36.7 C). Her blood pressure is 117/62 and her pulse is 70. Marland Kitchen  No palpable supraclavicular or axillary adenopathy. The lungs are clear to auscultation. The heart has a regular rhythm and rate. Examination of the left breast reveals a lumpectomy scar in the upper outer quadrant which is healing well without signs of drainage or infection. No dominant masses appreciated in the breast.  Lab Findings: Lab Results  Component Value Date   WBC 8.4 09/30/2013   HGB 14.3 10/09/2013   HCT 39.6 09/30/2013   MCV 92.3 09/30/2013   PLT 309 09/30/2013      Radiographic Findings: Mr Breast Bilateral W Wo Contrast  09/28/2013   CLINICAL DATA:  80 year old female with biopsy proven ductal carcinoma in situ with necrosis and calcifications from the upper-outer quadrant of  the left breast.  LABS:  BUN and creatinine were obtained on site at [facility] at  [address]  Results:  BUN 11 mg/dL,  Creatinine 0.7 mg/dL.  EXAM: BILATERAL BREAST MRI WITH AND WITHOUT CONTRAST  TECHNIQUE: Multiplanar, multisequence MR images of both breasts were obtained prior to and following the intravenous administration of 80m of MultiHance.  THREE-DIMENSIONAL MR IMAGE RENDERING ON INDEPENDENT WORKSTATION:  Three-dimensional MR images were rendered by post-processing of the original MR data on an independent workstation. The three-dimensional MR images were interpreted, and findings are reported in the following complete MRI report for this study. Three dimensional images were evaluated at the independent DynaCad workstation  COMPARISON:  Previous exams  FINDINGS: Breast composition: b.  Scattered fibroglandular  tissue.  Background parenchymal enhancement: Moderate  Right breast: No mass or abnormal enhancement.  Left breast: Stereotactic biopsy was performed from a lateral approach. Biopsy changes are seen in the upper-outer quadrant of the breast. Non masslike patchy enhancement measuring 2.7 cm is seen around the biopsy site. Enhancement is likely secondary to biopsy changes. No additional areas of abnormal enhancement seen in left breast.  Lymph nodes: No abnormal appearing lymph nodes.  Ancillary findings:  None.  IMPRESSION: Post biopsy changes in the upper-outer quadrant of the left breast corresponding to the area of the known ductal carcinoma in-situ.  RECOMMENDATION: Treatment planning of the known left breast ductal carcinoma in situ is recommended.  BI-RADS CATEGORY  6: Known biopsy-proven malignancy - appropriate action should be taken.   Electronically Signed   By: DLillia MountainM.D.   On: 09/28/2013 13:12   Mm Lt Plc Breast Loc Dev   1st Lesion  Inc Mammo Guide  10/09/2013   CLINICAL DATA:  Ductal carcinoma in situ of the left breast.  EXAM: NEEDLE LOCALIZATION OF THE LEFT BREAST WITH MAMMO GUIDANCE  COMPARISON:  Previous exams.  FINDINGS: Patient presents for needle localization prior to lumpectomy. I met with the patient and we discussed the procedure of needle localization including benefits and alternatives. We discussed the high likelihood of a successful procedure. We discussed the risks of the procedure, including infection, bleeding, tissue injury, and further surgery. Informed, written consent was given. The usual time-out protocol was performed immediately prior to the procedure.  Using mammographic guidance, sterile technique, 2% lidocaine and a 7 cm modified Kopans needle, biopsy clip was localized using a lateral to medial approach. The films were marked for Dr. TMarlou Starks  Specimen radiograph was performed at [facility] and confirms the biopsy clip and an intact wire present in the tissue sample.  The specimen was marked for pathology.  IMPRESSION: Needle localization left breast. No apparent complications.   Electronically Signed   By: SCurlene DolphinM.D.   On: 10/09/2013 15:07    Impression:  High-grade intraductal carcinoma of the left breast. I discussed the pathologic findings with the patient. She wishes to be aggressive with her management and wishes to proceed with radiation and adjuvant hormonal therapy as part of her postoperative management. The patient would appear to be a good candidate for hypofractionated accelerated treatment over approximately 3-1/2-4 weeks. Since her lumpectomy specimen showed no residual DCIS she will not require a boost after her whole breast radiation therapy.  Plan:  Simulation and planning on May 19 with treatments to begin one week later.  ____________________________________ JBlair Promise MD

## 2013-10-27 ENCOUNTER — Ambulatory Visit (INDEPENDENT_AMBULATORY_CARE_PROVIDER_SITE_OTHER): Payer: Medicare HMO | Admitting: General Surgery

## 2013-10-27 ENCOUNTER — Encounter (INDEPENDENT_AMBULATORY_CARE_PROVIDER_SITE_OTHER): Payer: Self-pay | Admitting: General Surgery

## 2013-10-27 VITALS — BP 134/80 | HR 67 | Temp 97.2°F | Ht 62.0 in | Wt 144.0 lb

## 2013-10-27 DIAGNOSIS — D0590 Unspecified type of carcinoma in situ of unspecified breast: Secondary | ICD-10-CM

## 2013-10-27 DIAGNOSIS — D0510 Intraductal carcinoma in situ of unspecified breast: Secondary | ICD-10-CM

## 2013-10-27 NOTE — Progress Notes (Signed)
Subjective:     Patient ID: Angelica Roberts, female   DOB: 1933/11/17, 80 y.o.   MRN: [id number]  HPI The patient is 2 weeks status post left breast lumpectomy for DCIS. Her final pathology showed only atypical lobular hyperplasia so for DCIS must of been removed by the core biopsy. She was ER-positive. She tolerated the surgery well. She has no complaints today. She has met with radiation oncology who has recommended 4 weeks of radiation therapy. She is not met with a medical oncologist yet.  Review of Systems     Objective:   Physical Exam On exam her left breast incision is healing nicely with no sign of infection or significant seroma    Assessment:     The patient is 2 weeks status post left breast lumpectomy for DCIS     Plan:     At this point she will continue to followup with medical and radiation oncology. I will plan to see her back in 3 months.

## 2013-10-27 NOTE — Patient Instructions (Signed)
Follow up with medical and radiation oncology

## 2013-11-03 ENCOUNTER — Ambulatory Visit
Admission: RE | Admit: 2013-11-03 | Discharge: 2013-11-03 | Disposition: A | Discharge status: Home / Self Care | Payer: Medicare HMO | Source: Ambulatory Visit | Attending: Radiation Oncology | Admitting: Radiation Oncology

## 2013-11-03 DIAGNOSIS — C50412 Malignant neoplasm of upper-outer quadrant of left female breast: Secondary | ICD-10-CM

## 2013-11-04 NOTE — Progress Notes (Signed)
  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  Identity was confirmed.  All relevant records and images related to the planned course of therapy were reviewed.  The patient freely provided informed written consent to proceed with treatment after reviewing the details related to the planned course of therapy. The consent form was witnessed and verified by the simulation staff.  Then, the patient was set-up in a stable reproducible  supine position for radiation therapy.  CT images were obtained.  Surface markings were placed.  The CT images were loaded into the planning software.  Then the target and avoidance structures were contoured.  Treatment planning then occurred.  The radiation prescription was entered and confirmed.  Then, I designed and supervised the construction of a total of 3 medically necessary complex treatment devices.  I have requested : 3D Simulation  I have requested a DVH of the following structures: heart, lungs, lumpectomy cavity.  I have ordered:dose calc.  PLAN:  The patient will receive 42.72 Gy in 16 fractions.  ________________________________  -----------------------------------  Blair Promise, PhD, MD

## 2013-11-05 NOTE — Addendum Note (Signed)
Encounter addended by: Blair Promise, MD on: 11/05/2013  2:13 PM<BR>     Documentation filed: Notes Section

## 2013-11-05 NOTE — Progress Notes (Signed)
  Radiation Oncology         [phone] ________________________________  Name: Angelica Roberts MRN: [id number]  Date: 11/03/2013  DOB: [birth year]  Optical Surface Tracking Plan:  Since intensity modulated radiotherapy (IMRT) and 3D conformal radiation treatment methods are predicated on accurate and precise positioning for treatment, intrafraction motion monitoring is medically necessary to ensure accurate and safe treatment delivery.  The ability to quantify intrafraction motion without excessive ionizing radiation dose can only be performed with optical surface tracking. Accordingly, surface imaging offers the opportunity to obtain 3D measurements of patient position throughout IMRT and 3D treatments without excessive radiation exposure.  I am ordering optical surface tracking for this patient's upcoming course of radiotherapy. ________________________________  Blair Promise, MD  Reference:   Particia Jasper, et al. Surface imaging-based analysis of intrafraction motion for breast radiotherapy patients.Journal of [place], n. 6, nov. 2014. ISSN [id number].   Available at: <http://www.jacmp.org/index.php/jacmp/article/view/4957>.

## 2013-11-11 ENCOUNTER — Encounter: Payer: Self-pay | Admitting: Genetic Counselor

## 2013-11-11 ENCOUNTER — Other Ambulatory Visit: Payer: Medicare HMO

## 2013-11-11 ENCOUNTER — Ambulatory Visit
Admission: RE | Admit: 2013-11-11 | Discharge: 2013-11-11 | Disposition: A | Discharge status: Home / Self Care | Payer: Medicare HMO | Source: Ambulatory Visit | Attending: Radiation Oncology | Admitting: Radiation Oncology

## 2013-11-11 ENCOUNTER — Ambulatory Visit (HOSPITAL_BASED_OUTPATIENT_CLINIC_OR_DEPARTMENT_OTHER): Payer: Medicare HMO | Admitting: Genetic Counselor

## 2013-11-11 DIAGNOSIS — Z803 Family history of malignant neoplasm of breast: Secondary | ICD-10-CM

## 2013-11-11 DIAGNOSIS — C50919 Malignant neoplasm of unspecified site of unspecified female breast: Secondary | ICD-10-CM | POA: Insufficient documentation

## 2013-11-11 DIAGNOSIS — C50412 Malignant neoplasm of upper-outer quadrant of left female breast: Secondary | ICD-10-CM

## 2013-11-11 DIAGNOSIS — Z8501 Personal history of malignant neoplasm of esophagus: Secondary | ICD-10-CM

## 2013-11-11 DIAGNOSIS — D0510 Intraductal carcinoma in situ of unspecified breast: Secondary | ICD-10-CM

## 2013-11-11 DIAGNOSIS — Z808 Family history of malignant neoplasm of other organs or systems: Secondary | ICD-10-CM

## 2013-11-11 DIAGNOSIS — Z800 Family history of malignant neoplasm of digestive organs: Secondary | ICD-10-CM

## 2013-11-11 DIAGNOSIS — D0590 Unspecified type of carcinoma in situ of unspecified breast: Secondary | ICD-10-CM

## 2013-11-11 DIAGNOSIS — Z801 Family history of malignant neoplasm of trachea, bronchus and lung: Secondary | ICD-10-CM

## 2013-11-11 NOTE — Progress Notes (Signed)
  Radiation Oncology         ([phone] ________________________________  Name: Angelica Roberts MRN: [id number]  Date: 11/11/2013  DOB: April 03, 1934  Simulation Verification Note  Status: outpatient  NARRATIVE: The patient was brought to the treatment unit and placed in the planned treatment position. The clinical setup was verified. Then port films were obtained and uploaded to the radiation oncology medical record software.  The treatment beams were carefully compared against the planned radiation fields. The position location and shape of the radiation fields was reviewed. They targeted volume of tissue appears to be appropriately covered by the radiation beams. Organs at risk appear to be excluded as planned.  Based on my personal review, I approved the simulation verification. The patient's treatment will proceed as planned.  -----------------------------------  Blair Promise, PhD, MD

## 2013-11-11 NOTE — Progress Notes (Signed)
Patient Name: Angelica Roberts Patient Age: 80 y.o. Encounter Date: 11/11/2013  Referring Physician: Autumn Messing, MD  Primary Care Provider: [name], MD   Angelica Roberts, a 80 y.o. female, is being seen at the [facility] due to a personal and family history of cancer.  She presents to clinic today to discuss the possibility of a hereditary predisposition to cancer and discuss whether genetic testing is warranted.  HISTORY OF PRESENT ILLNESS: Angelica Roberts was diagnosed with left breast cancer (DCIS) at the age of 37. She is s/p lumpectomy and is undergoing radiation. The breast tumor was ER positive and PR positive. Angelica Roberts reports a history of esophageal cancer at age ~87 as well as basal cell skin cancer on her face a few years ago.  She reports having a TAH/BSO at ~80 years old and was on premarin for ~15 years.  Past Medical History  Diagnosis Date  . Carotid artery occlusion     Bruit  . Glaucoma   . Postmenopausal HRT (hormone replacement therapy)   . GERD (gastroesophageal reflux disease)   . Osteoporosis   . Allergy     seasonal  . Gait abnormality   . Incontinence of bowel   . Constipation   . Bladder disorder June 2012    Sling  . Kidney stone   . Bone spur of toe     right big toe  . Weakness   . Hearing impaired   . Dyspnea on exertion   . Arthritis     osteoarthritis  . Back pain   . Anemia   . Cancer 02/2004    Esophageal , Skin cancer - basal cell  . Wears dentures     upper  . Wears hearing aid     Bilateral - hearing aids  . Wears partial dentures     partial - lower  . DCIS (ductal carcinoma in situ) of breast     left  . History of DPT vaccination 10/2011    per patient  . Tetanus toxoid vaccination administered 1-4 years ago 10/2011    per patient  . Vaccine for VZV (varicella-zoster virus)   . Malignant neoplasm of breast (female), unspecified site     Past Surgical History  Procedure Laterality Date  . Esopheageal  cancer  02/2004    Removed   . Hernial repain  1980  . Hernia repair  1982    corrective surgery  . Incontinence surgery  june 2012  . Cholecystectomy  1995    Gall Bladder  . Foot surgery Right July 2013    Bone spur  . Vaginal hysterectomy  1977  . Breast lumpectomy with needle localization Left 10/09/2013    Procedure: BREAST LUMPECTOMY WITH NEEDLE LOCALIZATION;  Surgeon: Merrie Roof, MD;  Location: [place];  Service: General;  Laterality: Left;  . Colonoscopy  2012    per patient    History   Social History  . Marital Status: Married    Spouse Name: N/A    Number of Children: 3  . Years of Education: N/A   Social History Main Topics  . Smoking status: Never Smoker   . Smokeless tobacco: Never Used  . Alcohol Use: No  . Drug Use: No  . Sexual Activity: Not Currently   Other Topics Concern  . Not on file   Social History Narrative  . No narrative on file     FAMILY HISTORY:  During the visit, a 4-generation pedigree was obtained. Significant diagnoses include the following:  Family History  Problem Relation Age of Onset  . Hernia Mother     Hiatal  . Diabetes Mother   . Heart attack Mother 31  . Stroke Mother 52  . Hypertension Father   . Stroke Father 74  . Diabetes Father   . Hyperlipidemia Father   . Stroke Sister   . Breast cancer Sister 32    TAH/BSO @ 63; currently 74  . Colon cancer Brother 52    lung cancer @ 50; currently 44  . Heart attack Maternal Grandmother   . Hernia Maternal Grandmother   . Stroke Paternal Grandmother 46  . Stroke Paternal Grandfather 13  . Cancer Brother     Esophageal in 31s; deceased at 70; non-smoker  . Cancer Maternal Grandfather     GI cancer in 42s; deceased 28s  . Breast cancer Maternal Aunt     dx 29s; deceased late 70s  . Cancer Paternal Uncle     2 pat uncles with GI cancer in late 70s/80  . Cancer Cousin     2 pat cousins with GI cancer <50yo (sons of uncle with GI cancer)     Additionally, she has 2 sons and a daughter. Both her sisters had a TAH/BSO. Her brother who had esophageal cancer in his 15s was a non-smoker.  Angelica Roberts ancestry is Caucasian - NOS. There is no known Jewish ancestry and no consanguinity.  ASSESSMENT AND PLAN: Angelica Roberts is a 80 y.o. female with a personal history of breast and esophageal cancer and family history of breast and GI cancers as noted above. This history in her is not highly suggestive of a hereditary predisposition to cancer because of her age at diagnosis, but testing is warranted given her family history in addition to her and her two sisters having TAH/BSO at young ages. We reviewed the characteristics, features and inheritance patterns of hereditary cancer syndromes. We also discussed genetic testing, including the other appropriate family members to test such as her sister, the process of testing, insurance coverage and implications of results. A negative test will be reassuring for her, but we discussed that her sister who had breast cancer at 25 should consider having a genetics evaluation  Angelica Roberts wished to pursue genetic testing and a blood sample will be sent to [facility] for analysis of 24 genes on the OvaNext. We discussed the implications of a positive, negative and/ or Variant of Uncertain Significance (VUS) result. Results should be available in approximately 5-6 weeks, at which point we will contact her and address implications for her as well as address genetic testing for at-risk family members, if needed.    We encouraged Angelica Roberts to remain in contact with Cancer Genetics annually so that we can update the family history and inform her of any changes in cancer genetics and testing that may be of benefit for this family. Ms.  Roberts questions were answered to her satisfaction today.   Thank you for the referral and allowing [place] to share in the care of your patient.   The patient was seen for a total of  30 minutes, greater than 50% of which was spent face-to-face counseling. This patient was discussed with the referring provider who agrees with the above.

## 2013-11-12 ENCOUNTER — Ambulatory Visit
Admission: RE | Admit: 2013-11-12 | Discharge: 2013-11-12 | Disposition: A | Discharge status: Home / Self Care | Payer: Medicare HMO | Source: Ambulatory Visit | Attending: Radiation Oncology | Admitting: Radiation Oncology

## 2013-11-13 ENCOUNTER — Ambulatory Visit
Admission: RE | Admit: 2013-11-13 | Discharge: 2013-11-13 | Disposition: A | Discharge status: Home / Self Care | Payer: Medicare HMO | Source: Ambulatory Visit | Attending: Radiation Oncology | Admitting: Radiation Oncology

## 2013-11-13 DIAGNOSIS — C50919 Malignant neoplasm of unspecified site of unspecified female breast: Secondary | ICD-10-CM

## 2013-11-13 MED ORDER — ALRA NON-METALLIC DEODORANT (RAD-ONC)
1.0000 "application " | Freq: Once | TOPICAL | Status: AC
  Administered 2013-11-13: 1 via TOPICAL

## 2013-11-13 MED ORDER — RADIAPLEXRX EX GEL
Freq: Once | CUTANEOUS | Status: AC
  Administered 2013-11-13: 14:00:00 via TOPICAL

## 2013-11-13 NOTE — Progress Notes (Signed)
Oriented patient to staff and routine of the clinic. Provided patient with RADIATION THERAPY AND YOU handbook then, reviewed pertinent information. Educated patient reference potential side effects and management such as, fatigue and skin changes. Provided patient with radiaplex gel and alra then, directed upon use. Encouraged patient to try both products on a small area of skin and watch for a reaction since she reports she has very sensitive skin. Patient verbalized understanding of all reviewed.

## 2013-11-16 ENCOUNTER — Ambulatory Visit
Admission: RE | Admit: 2013-11-16 | Discharge: 2013-11-16 | Disposition: A | Discharge status: Home / Self Care | Payer: Medicare HMO | Source: Ambulatory Visit | Attending: Radiation Oncology | Admitting: Radiation Oncology

## 2013-11-17 ENCOUNTER — Ambulatory Visit
Admission: RE | Admit: 2013-11-17 | Discharge: 2013-11-17 | Disposition: A | Discharge status: Home / Self Care | Payer: Medicare HMO | Source: Ambulatory Visit | Attending: Radiation Oncology | Admitting: Radiation Oncology

## 2013-11-17 VITALS — BP 131/77 | HR 66 | Temp 97.6°F | Resp 20 | Ht 62.0 in | Wt 145.8 lb

## 2013-11-17 DIAGNOSIS — C50412 Malignant neoplasm of upper-outer quadrant of left female breast: Secondary | ICD-10-CM

## 2013-11-17 NOTE — Progress Notes (Signed)
Angelica Roberts has had 4 fractions to hier left breast.  She has noticed some discomfort in her left shoulder yesterday afternoon.  She also has a red rash underneath her left breast.  She said she noticed it Friday morning and said it does not itch.  She has had a poison ivy rash on her right wrist but does not think the rash could be poison ivy.  She is using radiaplex gel.  She denies fatigue.

## 2013-11-17 NOTE — Progress Notes (Signed)
  Radiation Oncology         [phone] ________________________________  Name: Angelica Roberts MRN: [id number]  Date: 11/17/2013  DOB: 1934-03-06  Weekly Radiation Therapy Management  Breast cancer of upper-outer quadrant of left female breast    Primary site: Breast (Left)   Staging method: AJCC 7th Edition   Clinical: Stage 0 (Tis (DCIS), NX, cM0)    Summary: Stage 0 (Tis (DCIS), NX, cM0)  Current Dose: 10.68 Gy     Planned Dose:  42.72 Gy  Narrative . . . . . . . . The patient presents for routine under treatment assessment.                                   The patient is without complaint. She has noticed a rash in the inframammary fold but this does not bother her. She has  poison ivy along her hand and forearm areas.                                 Set-up films were reviewed.                                 The chart was checked. Physical Findings. . .  height is 5\' 2"  (1.575 m) and weight is 145 lb 12.8 oz (66.134 kg). Her oral temperature is 97.6 F (36.4 C). Her blood pressure is 131/77 and her pulse is 66. Her respiration is 20. .  The lungs are clear. The heart has regular rhythm and rate. Examination of left breast reveals some erythema in the inframammary fold,  otherwise minimal skin reaction. Impression . . . . . . . The patient is tolerating radiation. Plan . . . . . . . . . . . . Continue treatment as planned.  ________________________________   Blair Promise, PhD, MD

## 2013-11-18 ENCOUNTER — Ambulatory Visit
Admission: RE | Admit: 2013-11-18 | Discharge: 2013-11-18 | Disposition: A | Discharge status: Home / Self Care | Payer: Medicare HMO | Source: Ambulatory Visit | Attending: Radiation Oncology | Admitting: Radiation Oncology

## 2013-11-19 ENCOUNTER — Ambulatory Visit
Admission: RE | Admit: 2013-11-19 | Discharge: 2013-11-19 | Disposition: A | Discharge status: Home / Self Care | Payer: Medicare HMO | Source: Ambulatory Visit | Attending: Radiation Oncology | Admitting: Radiation Oncology

## 2013-11-20 ENCOUNTER — Ambulatory Visit
Admission: RE | Admit: 2013-11-20 | Discharge: 2013-11-20 | Disposition: A | Discharge status: Home / Self Care | Payer: Medicare HMO | Source: Ambulatory Visit | Attending: Radiation Oncology | Admitting: Radiation Oncology

## 2013-11-23 ENCOUNTER — Ambulatory Visit
Admission: RE | Admit: 2013-11-23 | Discharge: 2013-11-23 | Disposition: A | Discharge status: Home / Self Care | Payer: Medicare HMO | Source: Ambulatory Visit | Attending: Radiation Oncology | Admitting: Radiation Oncology

## 2013-11-24 ENCOUNTER — Ambulatory Visit
Admission: RE | Admit: 2013-11-24 | Discharge: 2013-11-24 | Disposition: A | Discharge status: Home / Self Care | Payer: Medicare HMO | Source: Ambulatory Visit | Attending: Radiation Oncology | Admitting: Radiation Oncology

## 2013-11-24 ENCOUNTER — Encounter: Payer: Self-pay | Admitting: Radiation Oncology

## 2013-11-24 VITALS — BP 110/63 | HR 79 | Temp 97.6°F | Resp 20 | Wt 143.6 lb

## 2013-11-24 DIAGNOSIS — C50412 Malignant neoplasm of upper-outer quadrant of left female breast: Secondary | ICD-10-CM

## 2013-11-24 NOTE — Progress Notes (Signed)
Pt denies pain, fatigue, loss of appetite. She is fatigued today due to taking a trip yesterday. She states the rash under her left breast is improving. She is applying Radiaplex twice daily.

## 2013-11-24 NOTE — Progress Notes (Signed)
  Radiation Oncology         [phone] ________________________________  Name: Angelica Roberts MRN: [id number]  Date: 11/24/2013  DOB: 1934-06-06  Weekly Radiation Therapy Management  Breast cancer of upper-outer quadrant of left female breast   Primary site: Breast (Left)   Staging method: AJCC 7th Edition   Clinical: Stage 0 (Tis (DCIS), NX, cM0) signed by Deatra Robinson, MD on 09/30/2013  1:27 PM   Summary: Stage 0 (Tis (DCIS), NX, cM0)  Current Dose: 24.03 Gy     Planned Dose:  42.72 Gy  Narrative . . . . . . . . The patient presents for routine under treatment assessment.                                   The patient is without complaint. The rash in the inframammary fold area is better since the patient is using Radiaplex.                                 Set-up films were reviewed.                                 The chart was checked. Physical Findings. . .  weight is 143 lb 9.6 oz (65.137 kg). Her temperature is 97.6 F (36.4 C). Her blood pressure is 110/63 and her pulse is 79. Her respiration is 20. . Minimal erythema throughout the breast area. Impression . . . . . . . The patient is tolerating radiation. Plan . . . . . . . . . . . . Continue treatment as planned.  ________________________________   Blair Promise, PhD, MD

## 2013-11-25 ENCOUNTER — Ambulatory Visit
Admission: RE | Admit: 2013-11-25 | Discharge: 2013-11-25 | Disposition: A | Discharge status: Home / Self Care | Payer: Medicare HMO | Source: Ambulatory Visit | Attending: Radiation Oncology | Admitting: Radiation Oncology

## 2013-11-26 ENCOUNTER — Other Ambulatory Visit: Payer: Medicare HMO

## 2013-11-26 ENCOUNTER — Ambulatory Visit: Payer: Medicare HMO | Admitting: Adult Health

## 2013-11-26 ENCOUNTER — Ambulatory Visit
Admission: RE | Admit: 2013-11-26 | Discharge: 2013-11-26 | Disposition: A | Discharge status: Home / Self Care | Payer: Medicare HMO | Source: Ambulatory Visit | Attending: Radiation Oncology | Admitting: Radiation Oncology

## 2013-11-27 ENCOUNTER — Ambulatory Visit
Admission: RE | Admit: 2013-11-27 | Discharge: 2013-11-27 | Disposition: A | Discharge status: Home / Self Care | Payer: Medicare HMO | Source: Ambulatory Visit | Attending: Radiation Oncology | Admitting: Radiation Oncology

## 2013-11-30 ENCOUNTER — Ambulatory Visit
Admission: RE | Admit: 2013-11-30 | Discharge: 2013-11-30 | Disposition: A | Discharge status: Home / Self Care | Payer: Medicare HMO | Source: Ambulatory Visit | Attending: Radiation Oncology | Admitting: Radiation Oncology

## 2013-12-01 ENCOUNTER — Ambulatory Visit
Admission: RE | Admit: 2013-12-01 | Discharge: 2013-12-01 | Disposition: A | Discharge status: Home / Self Care | Payer: Medicare HMO | Source: Ambulatory Visit | Attending: Radiation Oncology | Admitting: Radiation Oncology

## 2013-12-01 VITALS — BP 121/78 | HR 71 | Temp 98.1°F | Resp 16 | Ht 62.0 in | Wt 144.6 lb

## 2013-12-01 DIAGNOSIS — C50412 Malignant neoplasm of upper-outer quadrant of left female breast: Secondary | ICD-10-CM

## 2013-12-01 NOTE — Progress Notes (Signed)
Angelica Roberts has had 13 fractions to her left breast.  She reports occasional throbbing pains in her left breast that started a couple of days ago.  She denies fatigue.  The skin on her right breast is pink.  She has a rash underneath her left breast.  She denies any itching.  She is using radiaplex gel.

## 2013-12-01 NOTE — Progress Notes (Signed)
  Radiation Oncology         [phone] ________________________________  Name: Angelica Roberts MRN: [id number]  Date: 12/01/2013  DOB: Oct 24, 1933  Weekly Radiation Therapy Management  Breast cancer of upper-outer quadrant of left female breast   Primary site: Breast (Left)   Staging method: AJCC 7th Edition   Clinical: Stage 0 (Tis (DCIS), NX, cM0)    Summary: Stage 0 (Tis (DCIS), NX, cM0)   Current Dose: 37.38 Gy     Planned Dose:  42.72 Gy  Narrative . . . . . . . . The patient presents for routine under treatment assessment.                                   The patient is without complaint except for noticing throbbing sensation within the left breast really no significant pain.  She denies any itching or fatigue at this time.                                 Set-up films were reviewed.                                 The chart was checked. Physical Findings. . .  height is 5\' 2"  (1.575 m) and weight is 144 lb 9.6 oz (65.59 kg). Her oral temperature is 98.1 F (36.7 C). Her blood pressure is 121/78 and her pulse is 71. Her respiration is 16. . Weight essentially stable.  The left breast shows some erythema but no skin breakdown is appreciated. Impression . . . . . . . The patient is tolerating radiation. Plan . . . . . . . . . . . . Continue treatment as planned.  ________________________________   Blair Promise, PhD, MD

## 2013-12-02 ENCOUNTER — Ambulatory Visit
Admission: RE | Admit: 2013-12-02 | Discharge: 2013-12-02 | Disposition: A | Discharge status: Home / Self Care | Payer: Medicare HMO | Source: Ambulatory Visit | Attending: Radiation Oncology | Admitting: Radiation Oncology

## 2013-12-03 ENCOUNTER — Ambulatory Visit
Admission: RE | Admit: 2013-12-03 | Discharge: 2013-12-03 | Disposition: A | Discharge status: Home / Self Care | Payer: Medicare HMO | Source: Ambulatory Visit | Attending: Radiation Oncology | Admitting: Radiation Oncology

## 2013-12-09 ENCOUNTER — Encounter: Payer: Self-pay | Admitting: Genetic Counselor

## 2013-12-09 NOTE — Progress Notes (Signed)
Referring Physician: Autumn Messing, MD Lurline Del, MD   Ms. Elbaum was called today to discuss genetic test results. Please see the Genetics note from her visit on 11/11/13 for a detailed discussion of her personal and family history.  GENETIC TESTING: At the time of Ms. Mckenny visit, we recommended she pursue genetic testing of multiple genes on the OvaNext gene panel. This test, which included sequencing and deletion/duplication analysis of 24 genes, was performed at [facility]. Testing was normal and did not reveal a mutation in these genes. The genes tested were ATM, BARD1, BRCA1, BRCA2, BRIP1, CDH1, CHEK2, EPCAM, MLH1, MRE11A, MSH2, MSH6, MUTYH, NBN, NF1, PALB2, PMS2, PTEN, RAD50, RAD51C, RAD51D, SMARCA4, STK11, and TP53.  We discussed with Ms. Stenglein that since the current test is not perfect, it is possible there may be a gene mutation that current testing cannot detect, but that chance is small. We also discussed that it is possible that a different genetic factor, which was not part of this testing or has not yet been discovered, is responsible for the cancer diagnoses in the family. The likelihood of this is also low.  CANCER SCREENING: This result suggests that Ms. Hank cancers were most likely not due to an inherited predisposition. Most cancers happen by chance and this negative test, along with details of her family history, suggests that her cancer falls into this category. She is aware that her risk of colon cancer is somewhat increased due to her brother's history of colon cancer. We recommended she continue to follow the cancer screening guidelines provided by her physicians.    FAMILY MEMBERS: Women in the family are at some increased risk of developing cancer, over the general population risk, simply due to the family history. We recommended they have a yearly mammogram beginning at age 58, a yearly clinical breast exam, and perform monthly breast self-exams. A gynecologic  exam is recommended yearly. Colon cancer screening is recommended to begin by age 86.  While these results are reassuring for Ms. Vanalstine, this test does not tell us anything about Ms. Rick sister's genetic status. We recommend she also have genetic counseling and testing. Please let us know if we can help facilitate testing. Genetic counselors can be located in other cities, by visiting the website of the [employer] (ArtistMovie.se) and Field seismologist for a Dietitian by zip code.    Lastly, we discussed with Ms. Dalpe that cancer genetics is a rapidly advancing field and it is possible that new genetic tests will be appropriate for her in the future. We encouraged her to remain in contact with [place] on an annual basis so we can update her personal and family histories, and let her know of advances in cancer genetics that may benefit the family. Our contact number was provided. Ms. Ozdemir questions were answered to her satisfaction today, and she knows she is welcome to call anytime with additional questions.    Steele Berg, MS, [place] Certified Genetic Counseor phone: [phone] [email or web address]

## 2013-12-21 ENCOUNTER — Encounter: Payer: Self-pay | Admitting: Radiation Oncology

## 2013-12-21 NOTE — Progress Notes (Signed)
  Radiation Oncology         [phone] ________________________________  Name: Angelica Roberts MRN: [id number]  Date: 12/21/2013  DOB: 1933/12/29  End of Treatment Note  Diagnosis:    High-grade intraductal carcinoma of the left breast      Indication for treatment:  Breast conservation therapy       Radiation treatment dates:   May 28 through June 18  Site/dose:   Left breast, 42.72 gray in 16 fractions  Beams/energy:   Tangential beams, forward planned  Narrative: The patient tolerated radiation treatment relatively well.   She experienced some mild discomfort in the breast towards the end of her therapy.  Plan: The patient has completed radiation treatment. The patient will return to radiation oncology clinic for routine followup in one month. I advised them to call or return sooner if they have any questions or concerns related to their recovery or treatment.  -----------------------------------  Blair Promise, PhD, MD

## 2013-12-22 ENCOUNTER — Other Ambulatory Visit: Payer: Medicare HMO

## 2013-12-22 ENCOUNTER — Ambulatory Visit (HOSPITAL_BASED_OUTPATIENT_CLINIC_OR_DEPARTMENT_OTHER): Payer: Medicare HMO | Admitting: Oncology

## 2013-12-22 ENCOUNTER — Telehealth: Payer: Self-pay | Admitting: Oncology

## 2013-12-22 VITALS — BP 114/64 | HR 81 | Temp 98.5°F | Resp 18 | Ht 62.0 in | Wt 142.8 lb

## 2013-12-22 DIAGNOSIS — D0512 Intraductal carcinoma in situ of left breast: Secondary | ICD-10-CM

## 2013-12-22 DIAGNOSIS — C50412 Malignant neoplasm of upper-outer quadrant of left female breast: Secondary | ICD-10-CM

## 2013-12-22 DIAGNOSIS — Z170 Estrogen receptor positive status [ER+]: Secondary | ICD-10-CM

## 2013-12-22 DIAGNOSIS — D0590 Unspecified type of carcinoma in situ of unspecified breast: Secondary | ICD-10-CM

## 2013-12-22 NOTE — Telephone Encounter (Signed)
per pof to sch pt for appt-gqve pt copy of sch

## 2013-12-22 NOTE — Progress Notes (Signed)
Angelica Roberts  [phone] [phone]     ID: [name] DOB: 08/27/1933  MR#: [id number]  ENM#:[id number]  Patient Care Team: [name], MD as PCP - General (Family Medicine) [facility] [place]. Surgery   CHIEF COMPLAINT: Ductal carcinoma in situ  CURRENT TREATMENT: Completed radiation treatment   BREAST CANCER HISTORY: From Dr. Dana Allan original intake note 09/30/2013:  "Angelica Roberts is a 80 y.o. female. Who is seen in the multidisciplinary breast clinic for new diagnosis of DCIS. Patient underwent a screening mammogram that showed suspicious calcifications in the upper-outer quadrant of the left breast. She had a biopsy performed that showed high grade ductal carcinoma in situ with associated necrosis and calcifications. MRI of the breasts showed biopsy changes in the upper outer quadrant of the left breast. No lymph nodes no evidence of any suspicious lesions on the right. Her case was discussed in the multidisciplinary breast conference. She is now seen in the [facility] clinic for discussion of treatment options."  Her subsequent history is as detailed below  INTERVAL HISTORY: [name] returns today for followup of her breast cancer. Since her last visit here she underwent definitive surgery 10/09/2013, with the pathology ([id number]) showing only atypical lobular hyperplasia, all the DCIS having been removed in the original biopsy. She then proceeded to radiation treatments completed mid June she is here today to discuss antiestrogen therapy.  REVIEW OF SYSTEMS: She tolerated radiation "really well", with minimal skin changes. She sleeps poorly, which is not a new symptom. She describes herself is slightly fatigued. The heat particularly limits her outdoors activities. She has some hearing loss and hearing aid that works "okay". Her dentures also are "okay". She has a history of reflux particularly at bedtime. She  has significant vaginal dryness symptoms, with itching at times. Her skin is generally sensitive, but she does not bruise easily. She has moderate hot flashes.  PAST MEDICAL HISTORY: Past Medical History  Diagnosis Date  . Carotid artery occlusion     Bruit  . Glaucoma   . Postmenopausal HRT (hormone replacement therapy)   . GERD (gastroesophageal reflux disease)   . Osteoporosis   . Allergy     seasonal  . Gait abnormality   . Incontinence of bowel   . Constipation   . Bladder disorder June 2012    Sling  . Kidney stone   . Bone spur of toe     right big toe  . Weakness   . Hearing impaired   . Dyspnea on exertion   . Arthritis     osteoarthritis  . Back pain   . Anemia   . Cancer 02/2004    Esophageal , Skin cancer - basal cell  . Wears dentures     upper  . Wears hearing aid     Bilateral - hearing aids  . Wears partial dentures     partial - lower  . DCIS (ductal carcinoma in situ) of breast     left  . History of DPT vaccination 10/2011    per patient  . Tetanus toxoid vaccination administered 1-4 years ago 10/2011    per patient  . Vaccine for VZV (varicella-zoster virus)   . Malignant neoplasm of breast (female), unspecified site     PAST SURGICAL HISTORY: Past Surgical History  Procedure Laterality Date  . Esopheageal cancer  02/2004    Removed   . Hernial repain  1980  . Hernia  repair  1982    corrective surgery  . Incontinence surgery  june 2012  . Cholecystectomy  1995    Gall Bladder  . Foot surgery Right July 2013    Bone spur  . Vaginal hysterectomy  1977  . Breast lumpectomy with needle localization Left 10/09/2013    Procedure: BREAST LUMPECTOMY WITH NEEDLE LOCALIZATION;  Surgeon: Merrie Roof, MD;  Location: [facility];  Service: General;  Laterality: Left;  . Colonoscopy  2012    per patient    FAMILY HISTORY Family History  Problem Relation Age of Onset  . Hernia Mother     Hiatal  . Diabetes Mother   . Heart  attack Mother 21  . Stroke Mother 21  . Hypertension Father   . Stroke Father 44  . Diabetes Father   . Hyperlipidemia Father   . Stroke Sister   . Breast cancer Sister 78    TAH/BSO @ 30; currently 30  . Colon cancer Brother 34    lung cancer @ 72; currently 84  . Heart attack Maternal Grandmother   . Hernia Maternal Grandmother   . Stroke Paternal Grandmother 66  . Stroke Paternal Grandfather 61  . Cancer Brother     Esophageal in 34s; deceased at 52; non-smoker  . Cancer Maternal Grandfather     GI cancer in 75s; deceased 34s  . Breast cancer Maternal Aunt     dx 32s; deceased late 14s  . Cancer Paternal Uncle     2 pat uncles with GI cancer in late 70s/80  . Cancer Cousin     2 pat cousins with GI cancer <50yo (sons of uncle with GI cancer)   the patient's father died at the age of 33, in his sleep, with a history of heart disease. The patient's mother died at the age 45. She has had a stroke and heart attack at the age of 71. Both her parents had diabetes. The patient has 3 brothers. One had esophageal cancer but died from a heart attack. A second brother is currently 72. He also has had a heart attack in the past. The third brother, 80, has a history of rheumatoid arthritis and has had both GI and lung cancers. The patient has 2 sisters. One had breast cancer diagnosed at age 62. The other one is 62.  GYNECOLOGIC HISTORY:  No LMP recorded. Patient has had a hysterectomy. Menarche age 18, first live birth age 59, the patient is [place] P3. She underwent hysterectomy but she does not know whether with or without salpingo-oophorectomy at age 4. She did take hormone replacement until 2005 when she developed her esophageal cancer.  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 Her second husband is 9 years old. He is a "semi-invalid." The patient is his primary caregiver. She has 3 children from prior marriages: Karren Burly who lives in [place] and is  in business administration, Kathlen Mody who lives in [place] and is says Economist; and Daleyza Gadomski who lives in [place] and is self-employed. The patient has 2 grandchildren. She attends the [employer]: In place; however the patient is not sure she trusts her husband, who is elderly, as her healthcare power of attorney. On the 12/22/2013 visit I gave the patient appropriate documents to complete and notarize so she can choose a different healthcare power of attorney at her discretion  HEALTH MAINTENANCE: History  Substance Use Topics  . Smoking status: Never Smoker   . Smokeless tobacco: Never Used  . Alcohol Use: No     Colonoscopy:  PAP:  Bone density:  Lipid panel:  Allergies  Allergen Reactions  . Shellfish Allergy Diarrhea, Nausea Only and Rash  . Iodine     Current Outpatient Prescriptions  Medication Sig Dispense Refill  . b complex vitamins tablet Take 1 tablet by mouth daily.      . Biotin 1000 MCG tablet Take 1,000 mcg by mouth 3 (three) times daily. Hair, skin, nails      . calcium carbonate (OS-CAL) 600 MG TABS Take 333 mg by mouth 2 (two) times daily with a meal.       . Cholecalciferol (VITAMIN D3) 3000 UNITS TABS Take 1,000 mg by mouth. Take 1000 IU daily      . denosumab (PROLIA) 60 MG/ML SOLN injection Inject 60 mg into the skin every 6 (six) months. Administer in upper arm, thigh, or abdomen      . diphenhydrAMINE (SOMINEX) 25 MG tablet Take 25 mg by mouth at bedtime as needed for sleep.      Marland Kitchen esomeprazole (NEXIUM) 40 MG capsule Take 22.3 mg by mouth once.       . famotidine (PEPCID) 20 MG tablet Take 20 mg by mouth 2 (two) times daily.      Marland Kitchen ibuprofen (ADVIL,MOTRIN) 100 MG tablet Take 100 mg by mouth every 6 (six) hours as needed for fever.      . Loratadine (CLARITIN REDITABS) 5 MG TBDP Take 10 mg by mouth daily.       . non-metallic deodorant Jethro Poling) MISC Apply 1 application topically daily  as needed.      . Wound Cleansers (RADIAPLEX EX) Apply topically.       No current facility-administered medications for this visit.    OBJECTIVE: Elderly white woman who appears in good health Filed Vitals:   12/22/13 1400  BP: 114/64  Pulse: 81  Temp: 98.5 F (36.9 C)  Resp: 18     Body mass index is 26.11 kg/(m^2).    ECOG FS:0 - Asymptomatic  Ocular: Sclerae unicteric, pupils equal, round and reactive to light Ear-nose-throat: Oropharynx clear and moist Lymphatic: No cervical or supraclavicular adenopathy Lungs no rales or rhonchi, good excursion bilaterally Heart regular rate and rhythm Abd soft, nontender, positive bowel sounds MSK no focal spinal tenderness, no joint edema Neuro: non-focal, well-oriented, appropriate affect Breasts: The right breast is unremarkable. The left breast is status post lumpectomy and radiation. There are no significant findings. The left axilla is benign.   LAB RESULTS:  CMP     Component Value Date/Time   NA 144 09/30/2013 1159   K 3.7 09/30/2013 1159   CO2 27 09/30/2013 1159   GLUCOSE 105 09/30/2013 1159   BUN 15.0 09/30/2013 1159   CREATININE 0.9 09/30/2013 1159   CALCIUM 9.8 09/30/2013 1159   PROT 7.5 09/30/2013 1159   ALBUMIN 3.8 09/30/2013 1159   AST 21 09/30/2013 1159   ALT 15 09/30/2013 1159   ALKPHOS 74 09/30/2013 1159   BILITOT 0.81 09/30/2013 1159    I No results found for this basename: SPEP,  UPEP,   kappa and lambda light chains    Lab Results  Component Value Date   WBC 8.4 09/30/2013   NEUTROABS 5.6 09/30/2013   HGB 14.3 10/09/2013   HCT 39.6 09/30/2013   MCV 92.3 09/30/2013   PLT 309  09/30/2013      Chemistry      Component Value Date/Time   NA 144 09/30/2013 1159   K 3.7 09/30/2013 1159   CO2 27 09/30/2013 1159   BUN 15.0 09/30/2013 1159   CREATININE 0.9 09/30/2013 1159      Component Value Date/Time   CALCIUM 9.8 09/30/2013 1159   ALKPHOS 74 09/30/2013 1159   AST 21 09/30/2013 1159   ALT 15 09/30/2013 1159   BILITOT  0.81 09/30/2013 1159       No results found for this basename: LABCA2    No components found with this basename: LABCA125    No results found for this basename: INR,  in the last 168 hours  Urinalysis No results found for this basename: colorurine,  appearanceur,  labspec,  phurine,  glucoseu,  hgbur,  bilirubinur,  ketonesur,  proteinur,  urobilinogen,  nitrite,  leukocytesur    STUDIES: No results found.  ASSESSMENT: 80 y.o. BRCA negative Shea Stakes [place] woman status post left upper outer quadrant biopsy 09/21/2013 ([id number]) for ductal carcinoma in situ, high-grade, estrogen receptor 100% positive, progesterone receptor 15% positive.  (1) status post left lumpectomy 10/09/2013 showing only atypical lobular hyperplasia, no residual DCIS ([id number])  (2) adjuvant radiation completed 12/03/2013  (3) genetic testing June 2015 was normal and did not reveal a mutation in these genes: ATM, BARD1, BRCA1, BRCA2, BRIP1, CDH1, CHEK2, EPCAM, MLH1, MRE11A, MSH2, MSH6, MUTYH, NBN, NF1, PALB2, PMS2, PTEN, RAD50, RAD51C, RAD51D, SMARCA4, STK11, and TP53  (4) the patient opted against antiestrogen therapy  PLAN: I spent approximately 50 minutes with the patient going over her situation. She understands that noninvasive breast cancer is in itself not life threatening. The cancer cells are trapped in the milk ducts and cannot travel to vital organ. Because she chose to keep her breast, which is in my opinion the correct decision, she accepts some risk of the cancer coming back in the only place right neck and back, which is the left breast. I receiving radiation she has greatly decreased that risk, which I would calculate now to be less than 10%.  If in addition she took antiestrogen therapy for 5 years, that risk would be cut in half. This means you're benefit from tamoxifen or anastrozole will be in the 4 or 5% range. We then discussed the possible toxicities side effects and complications of these  agents. She understands that tamoxifen can cause blood clots and that anastrozole can worsen osteoporosis.  After a full discussion she decided not to receive anti-estrogens, and in her case I am very comfortable with that decision.  I will followup with her on a once a year basis for 5 years, to be scheduled in March after her mammogram, and I will do a full breast exam and physical exam but no labs at that visit.  The patient has a good understanding of the overall plan. She agrees with it. She knows the goal of treatment in her case is cure. She will call with any problems that may develop before her next visit here.  Chauncey Cruel, MD   12/22/2013 8:38 PM

## 2013-12-23 NOTE — Addendum Note (Signed)
Addended by: Laureen Abrahams on: 12/23/2013 03:59 PM   Modules accepted: Orders

## 2014-01-06 ENCOUNTER — Encounter: Payer: Self-pay | Admitting: Oncology

## 2014-01-07 ENCOUNTER — Ambulatory Visit
Admission: RE | Admit: 2014-01-07 | Discharge: 2014-01-07 | Disposition: A | Discharge status: Home / Self Care | Payer: Medicare HMO | Source: Ambulatory Visit | Attending: Radiation Oncology | Admitting: Radiation Oncology

## 2014-01-07 ENCOUNTER — Encounter: Payer: Self-pay | Admitting: Radiation Oncology

## 2014-01-07 VITALS — BP 109/84 | HR 84 | Temp 97.9°F | Ht 62.0 in | Wt 144.3 lb

## 2014-01-07 DIAGNOSIS — C50412 Malignant neoplasm of upper-outer quadrant of left female breast: Secondary | ICD-10-CM

## 2014-01-07 NOTE — Progress Notes (Signed)
Jannifer Rodney here for follow up after treatment to her left breast.  She denies pain but has occasional sharp pains through her left breast.  She reports that she had crusting on her left nipple which has gone away.  She is not taking antihormone therapy.  The skin on her left upper breast has hyperpigmentation.  She has some peeling areas underneath her left breast.  She is using radiaplex on these areas.

## 2014-01-07 NOTE — Progress Notes (Signed)
  Radiation Oncology         [phone] ________________________________  Name: Angelica Roberts MRN: [id number]  Date: 01/07/2014  DOB: Jul 12, 1933  Follow-Up Visit Note  CC: [name], MD  Merrie Roof, MD  Diagnosis:   High-grade intraductal carcinoma of the left breast  Interval Since Last Radiation:  1  months  Narrative:  The patient returns today for routine follow-up.  She is doing well at this time. She occasionally will have some discomfort within the left breast but no consistent pain. She denies any fatigue at this time. Patient was seen by medical oncology and she elected not to proceed with anti-estrogen therapy.                              ALLERGIES:  is allergic to shellfish allergy and iodine.  Meds: Current Outpatient Prescriptions  Medication Sig Dispense Refill  . b complex vitamins tablet Take 1 tablet by mouth daily.      . Biotin 1000 MCG tablet Take 1,000 mcg by mouth 3 (three) times daily. Hair, skin, nails      . calcium carbonate (OS-CAL) 600 MG TABS Take 333 mg by mouth 2 (two) times daily with a meal.       . Cholecalciferol (VITAMIN D3) 3000 UNITS TABS Take 1,000 mg by mouth. Take 1000 IU daily      . denosumab (PROLIA) 60 MG/ML SOLN injection Inject 60 mg into the skin every 6 (six) months. Administer in upper arm, thigh, or abdomen      . esomeprazole (NEXIUM) 40 MG capsule Take 22.3 mg by mouth once.       . famotidine (PEPCID) 20 MG tablet Take 20 mg by mouth 2 (two) times daily.      Marland Kitchen ibuprofen (ADVIL,MOTRIN) 100 MG tablet Take 100 mg by mouth every 6 (six) hours as needed for fever.      . loratadine (CLARITIN) 5 MG chewable tablet Chew 5 mg by mouth.      . Multiple Vitamins-Minerals (MULTIVITAMIN PO) Take by mouth.      . non-metallic deodorant Jethro Poling) MISC Apply 1 application topically daily as needed.      . Wound Cleansers (RADIAPLEX EX) Apply topically.       No current facility-administered medications for this encounter.     Physical Findings: The patient is in no acute distress. Patient is alert and oriented.  height is 5\' 2"  (1.575 m) and weight is 144 lb 4.8 oz (65.454 kg). Her oral temperature is 97.9 F (36.6 C). Her blood pressure is 109/84 and her pulse is 84. Marland Kitchen  No palpable supraclavicular or axillary adenopathy. The lungs are clear to auscultation. The heart has regular rhythm and rate. Examination of the left breast reveals some hyperpigmentation changes and mild edema. No dominant mass appreciated within the breast nipple discharge or bleeding.  Lab Findings: Lab Results  Component Value Date   WBC 8.4 09/30/2013   HGB 14.3 10/09/2013   HCT 39.6 09/30/2013   MCV 92.3 09/30/2013   PLT 309 09/30/2013      Radiographic Findings: No results found.  Impression:  The patient is recovering from the effects of radiation.  No signs of recurrence on clinical exam today  Plan:  Routine followup in 3 months.  ____________________________________ Blair Promise, MD

## 2014-02-02 ENCOUNTER — Ambulatory Visit (INDEPENDENT_AMBULATORY_CARE_PROVIDER_SITE_OTHER): Payer: Medicare HMO | Admitting: General Surgery

## 2014-02-02 VITALS — BP 136/80 | HR 72 | Temp 97.0°F | Ht 62.0 in | Wt 141.0 lb

## 2014-02-02 DIAGNOSIS — D0590 Unspecified type of carcinoma in situ of unspecified breast: Secondary | ICD-10-CM

## 2014-02-02 DIAGNOSIS — D0512 Intraductal carcinoma in situ of left breast: Secondary | ICD-10-CM

## 2014-02-02 NOTE — Progress Notes (Signed)
Subjective:     Patient ID: Angelica Roberts, female   DOB: [birth year], 80 y.o.   MRN: [id number]  HPI The patient is a 80 year old white female who is 3 months status post left breast lumpectomy for DCIS. She was ER positive. She has finished radiation therapy. Her oncologist has decided not to treat her with antiestrogen. She tolerated her treatment well. She has no complaints today. Her only complaint is that she has some occasional throbbing in the left breast  Review of Systems  Constitutional: Negative.   HENT: Negative.   Eyes: Negative.   Respiratory: Negative.   Cardiovascular: Negative.   Gastrointestinal: Negative.   Endocrine: Negative.   Genitourinary: Negative.   Musculoskeletal: Negative.   Skin: Negative.   Allergic/Immunologic: Negative.   Neurological: Negative.   Hematological: Negative.   Psychiatric/Behavioral: Negative.        Objective:   Physical Exam  Constitutional: She is oriented to person, place, and time. She appears well-developed and well-nourished.  HENT:  Head: Normocephalic and atraumatic.  Eyes: Conjunctivae and EOM are normal. Pupils are equal, round, and reactive to light.  Neck: Normal range of motion. Neck supple.  Cardiovascular: Normal rate, regular rhythm and normal heart sounds.   Pulmonary/Chest: Effort normal and breath sounds normal.  Her left breast incision is healing nicely with no sign of infection or significant seroma. There is no palpable mass in either breast. There is no palpable axillary, supraclavicular, or cervical lymphadenopathy.  Abdominal: Soft. Bowel sounds are normal.  Musculoskeletal: Normal range of motion.  Lymphadenopathy:    She has no cervical adenopathy.  Neurological: She is alert and oriented to person, place, and time.  Skin: Skin is warm and dry.  Psychiatric: She has a normal mood and affect. Her behavior is normal.       Assessment:     The patient is 3 months status post left breast lumpectomy  for DCIS     Plan:     At this point she will continue to do regular self exams. I will plan to see her back in about 6 months.

## 2014-02-02 NOTE — Patient Instructions (Signed)
Continue regular self exams  

## 2014-04-01 ENCOUNTER — Encounter: Payer: Self-pay | Admitting: Radiation Oncology

## 2014-04-01 ENCOUNTER — Ambulatory Visit
Admission: RE | Admit: 2014-04-01 | Discharge: 2014-04-01 | Disposition: A | Discharge status: Home / Self Care | Payer: Medicare HMO | Source: Ambulatory Visit | Attending: Radiation Oncology | Admitting: Radiation Oncology

## 2014-04-01 VITALS — BP 112/51 | HR 68 | Temp 98.2°F | Resp 16 | Ht 62.0 in | Wt 140.1 lb

## 2014-04-01 DIAGNOSIS — C50412 Malignant neoplasm of upper-outer quadrant of left female breast: Secondary | ICD-10-CM

## 2014-04-01 NOTE — Progress Notes (Signed)
Angelica Roberts here for follow up after treatment to her left breast.  She denies pain but does have a pulling feeling in her left breast when she lifts her left arm or while doing yard work.  She denies fatigue.  She had dental surgery recently to remove her bottom teeth.  She is having trouble chewing because of this.  The skin on her left breast is intact.  She has slight hyperpigmentation under her left breast.  She is not taking anti-estrogen medication.

## 2014-04-01 NOTE — Progress Notes (Signed)
Radiation Oncology         [phone] ________________________________  Name: Angelica Roberts MRN: [id number]  Date: 04/01/2014  DOB: Feb 09, 1934  Follow-Up Visit Note  CC: [name], MD  [name], MD    ICD-9-CM ICD-10-CM  1. Breast cancer of upper-outer quadrant of left female breast 174.4 C50.412    Diagnosis:   High-grade intraductal carcinoma of the left breast  Interval Since Last Radiation:  4  months  Narrative:  The patient returns today for routine follow-up.  She is doing well at this time except for some tightness along the upper-outer aspect of her left chest. I have given her arm exercises to address this issue. I also offered physical therapy evaluation and treatment at this point the patient would like to try home exercises. She denies any swelling in her left arm or hand. She denies any pain within the left breast nipple discharge or bleeding. Patient elected not to pursue adjuvant hormonal therapy.                              ALLERGIES:  is allergic to shellfish allergy and iodine.  Meds: Current Outpatient Prescriptions  Medication Sig Dispense Refill  . b complex vitamins tablet Take 1 tablet by mouth daily.      . Biotin 1000 MCG tablet Take 1,000 mcg by mouth 3 (three) times daily. Hair, skin, nails      . calcium carbonate (OS-CAL) 600 MG TABS Take 333 mg by mouth 2 (two) times daily with a meal.       . Cholecalciferol (VITAMIN D3) 3000 UNITS TABS Take 1,000 mg by mouth. Take 1000 IU daily      . denosumab (PROLIA) 60 MG/ML SOLN injection Inject 60 mg into the skin every 6 (six) months. Administer in upper arm, thigh, or abdomen      . esomeprazole (NEXIUM) 40 MG capsule Take 22.3 mg by mouth once.       . famotidine (PEPCID) 20 MG tablet Take 20 mg by mouth 2 (two) times daily.      Marland Kitchen ibuprofen (ADVIL,MOTRIN) 100 MG tablet Take 100 mg by mouth every 6 (six) hours as needed for fever.      . loratadine (CLARITIN) 5 MG chewable tablet Chew 5 mg by  mouth.      . Multiple Vitamins-Minerals (MULTIVITAMIN PO) Take by mouth.       No current facility-administered medications for this encounter.    Physical Findings: The patient is in no acute distress. Patient is alert and oriented.  height is 5\' 2"  (1.575 m) and weight is 140 lb 1.6 oz (63.549 kg). Her oral temperature is 98.2 F (36.8 C). Her blood pressure is 112/51 and her pulse is 68. Her respiration is 16. Marland Kitchen No palpable supraclavicular or axillary adenopathy. The lungs are clear to auscultation. The heart has regular rhythm and rate. Examination of the right breast reveals no mass or nipple discharge. Examination of the left breast reveals some mild hyperpigmentation changes. Patient has mild edema in the breast. No dominant masses appreciated breast nipple discharge or bleeding.  Lab Findings: Lab Results  Component Value Date   WBC 8.4 09/30/2013   HGB 14.3 10/09/2013   HCT 39.6 09/30/2013   MCV 92.3 09/30/2013   PLT 309 09/30/2013    Radiographic Findings: No results found.  Impression:  No evidence of recurrence on clinical exam today  Plan:  Routine followup in 6 months.  ____________________________________ Blair Promise, MD

## 2014-07-15 ENCOUNTER — Encounter: Payer: Self-pay | Admitting: Family

## 2014-07-19 ENCOUNTER — Ambulatory Visit (HOSPITAL_COMMUNITY)
Admission: RE | Admit: 2014-07-19 | Discharge: 2014-07-19 | Disposition: A | Discharge status: Home / Self Care | Payer: Medicare HMO | Source: Ambulatory Visit | Attending: Family | Admitting: Family

## 2014-07-19 ENCOUNTER — Encounter: Payer: Medicare HMO | Admitting: Family

## 2014-07-19 DIAGNOSIS — I6523 Occlusion and stenosis of bilateral carotid arteries: Secondary | ICD-10-CM

## 2014-07-19 NOTE — Progress Notes (Signed)
Lab only 

## 2014-07-20 ENCOUNTER — Other Ambulatory Visit: Payer: Self-pay

## 2014-07-20 ENCOUNTER — Encounter: Payer: Self-pay | Admitting: Family

## 2014-07-20 DIAGNOSIS — I6523 Occlusion and stenosis of bilateral carotid arteries: Secondary | ICD-10-CM

## 2014-07-20 NOTE — Patient Instructions (Signed)
Dear Ms. Brass, Your recent Vascular Lab visit Feb. 1, 2016 indicates: No significant change when compared to the previous exam on Jan. 29, 2015. Please follow up in One year.         Stroke Prevention Some medical conditions and behaviors are associated with an increased chance of having a stroke. You may prevent a stroke by making healthy choices and managing medical conditions. HOW CAN I REDUCE MY RISK OF HAVING A STROKE?   Stay physically active. Get at least 30 minutes of activity on most or all days.  Do not smoke. It may also be helpful to avoid exposure to secondhand smoke.  Limit alcohol use. Moderate alcohol use is considered to be:  No more than 2 drinks per day for men.  No more than 1 drink per day for nonpregnant women.  Eat healthy foods. This involves:  Eating 5 or more servings of fruits and vegetables a day.  Making dietary changes that address high blood pressure (hypertension), high cholesterol, diabetes, or obesity.  Manage your cholesterol levels.  Making food choices that are high in fiber and low in saturated fat, trans fat, and cholesterol may control cholesterol levels.  Take any prescribed medicines to control cholesterol as directed by your health care provider.  Manage your diabetes.  Controlling your carbohydrate and sugar intake is recommended to manage diabetes.  Take any prescribed medicines to control diabetes as directed by your health care provider.  Control your hypertension.  Making food choices that are low in salt (sodium), saturated fat, trans fat, and cholesterol is recommended to manage hypertension.  Take any prescribed medicines to control hypertension as directed by your health care provider.  Maintain a healthy weight.  Reducing calorie intake and making food choices that are low in sodium, saturated fat, trans fat, and cholesterol are recommended to manage weight.  Stop drug abuse.  Avoid taking birth control  pills.  Talk to your health care provider about the risks of taking birth control pills if you are over 23 years old, smoke, get migraines, or have ever had a blood clot.  Get evaluated for sleep disorders (sleep apnea).  Talk to your health care provider about getting a sleep evaluation if you snore a lot or have excessive sleepiness.  Take medicines only as directed by your health care provider.  For some people, aspirin or blood thinners (anticoagulants) are helpful in reducing the risk of forming abnormal blood clots that can lead to stroke. If you have the irregular heart rhythm of atrial fibrillation, you should be on a blood thinner unless there is a good reason you cannot take them.  Understand all your medicine instructions.  Make sure that other conditions (such as anemia or atherosclerosis) are addressed. SEEK IMMEDIATE MEDICAL CARE IF:   You have sudden weakness or numbness of the face, arm, or leg, especially on one side of the body.  Your face or eyelid droops to one side.  You have sudden confusion.  You have trouble speaking (aphasia) or understanding.  You have sudden trouble seeing in one or both eyes.  You have sudden trouble walking.  You have dizziness.  You have a loss of balance or coordination.  You have a sudden, severe headache with no known cause.  You have new chest pain or an irregular heartbeat. Any of these symptoms may represent a serious problem that is an emergency. Do not wait to see if the symptoms will go away. Get medical help at once.  Call your local emergency services (911 in [place] Do not drive yourself to the hospital. Document Released: 07/12/2004 Document Revised: 10/19/2013 Document Reviewed: 12/05/2012 [facility] Patient Information 2015 [place], [place]. This information is not intended to replace advice given to you by your health care provider. Make sure you discuss any questions you have with your health care provider.

## 2014-07-23 ENCOUNTER — Encounter: Payer: Self-pay | Admitting: Surgery

## 2014-08-13 ENCOUNTER — Other Ambulatory Visit: Payer: Self-pay | Admitting: Oncology

## 2014-08-13 DIAGNOSIS — Z853 Personal history of malignant neoplasm of breast: Secondary | ICD-10-CM

## 2014-08-13 DIAGNOSIS — Z9889 Other specified postprocedural states: Secondary | ICD-10-CM

## 2014-08-31 ENCOUNTER — Ambulatory Visit: Payer: Medicare HMO | Admitting: Oncology

## 2014-09-13 ENCOUNTER — Ambulatory Visit
Admission: RE | Admit: 2014-09-13 | Discharge: 2014-09-13 | Disposition: A | Discharge status: Home / Self Care | Payer: Medicare HMO | Source: Ambulatory Visit | Attending: Oncology | Admitting: Oncology

## 2014-09-13 DIAGNOSIS — Z9889 Other specified postprocedural states: Secondary | ICD-10-CM

## 2014-09-13 DIAGNOSIS — Z853 Personal history of malignant neoplasm of breast: Secondary | ICD-10-CM

## 2014-09-23 ENCOUNTER — Telehealth: Payer: Self-pay | Admitting: Oncology

## 2014-09-23 ENCOUNTER — Encounter: Payer: Self-pay | Admitting: Radiation Oncology

## 2014-09-23 ENCOUNTER — Ambulatory Visit
Admission: RE | Admit: 2014-09-23 | Discharge: 2014-09-23 | Disposition: A | Discharge status: Home / Self Care | Payer: Medicare HMO | Source: Ambulatory Visit | Attending: Radiation Oncology | Admitting: Radiation Oncology

## 2014-09-23 VITALS — BP 118/52 | HR 75 | Temp 97.4°F | Resp 20 | Ht 63.0 in | Wt 135.9 lb

## 2014-09-23 DIAGNOSIS — C50412 Malignant neoplasm of upper-outer quadrant of left female breast: Secondary | ICD-10-CM

## 2014-09-23 NOTE — Progress Notes (Signed)
Radiation Oncology         [phone] ________________________________  Name: Angelica Roberts MRN: [id number]  Date: 09/23/2014  DOB: 12-27-1933  Follow-Up Visit Note  CC: [name], MD  Donald Prose, MD    ICD-9-CM ICD-10-CM   1. Breast cancer of upper-outer quadrant of left female breast 174.4 C50.412     DiagnosisHigh-grade intraductal carcinoma of the left breast:     Interval Since Last Radiation:  10  months  Narrative:  The patient returns today for routine follow-up.  She is doing well at this time. She continues to follow-up with medical oncology on a regular basis. Patient did undergo mammography March 28 of this year showing no suspicious areas in either breast. This denies any consistent pain in the left breast occasionally she will notice some sharp shooting pains on a very intermittent basis. Patient denies any nipple discharge or bleeding. She has noticed some occasional crusting along the nipple area of the left breast.                              ALLERGIES:  is allergic to shellfish allergy and iodine.  Meds: Current Outpatient Prescriptions  Medication Sig Dispense Refill  . b complex vitamins tablet Take 1 tablet by mouth daily.    . Biotin 1000 MCG tablet Take 1,000 mcg by mouth 3 (three) times daily. Hair, skin, nails    . calcium carbonate (OS-CAL) 600 MG TABS Take 333 mg by mouth 2 (two) times daily with a meal.     . Cholecalciferol (VITAMIN D3) 3000 UNITS TABS Take 1,000 mg by mouth. Take 1000 IU daily    . esomeprazole (NEXIUM) 40 MG capsule Take 22.3 mg by mouth once.     . famotidine (PEPCID) 20 MG tablet Take 20 mg by mouth 2 (two) times daily.    Marland Kitchen ibuprofen (ADVIL,MOTRIN) 100 MG tablet Take 100 mg by mouth every 6 (six) hours as needed for fever.    . latanoprost (XALATAN) 0.005 % ophthalmic solution     . loratadine (CLARITIN) 5 MG chewable tablet Chew 5 mg by mouth.    . Multiple Vitamins-Minerals (MULTIVITAMIN PO) Take by mouth.    . denosumab  (PROLIA) 60 MG/ML SOLN injection Inject 60 mg into the skin every 6 (six) months. Administer in upper arm, thigh, or abdomen     No current facility-administered medications for this encounter.    Physical Findings: The patient is in no acute distress. Patient is alert and oriented.  height is 5\' 3"  (1.6 m) and weight is 135 lb 14.4 oz (61.644 kg). Her oral temperature is 97.4 F (36.3 C). Her blood pressure is 118/52 and her pulse is 75. Her respiration is 20. Marland Kitchen    No Palpable subclavicular or axillary adenopathy. The lungs are clear to auscultation. The heart has a regular rhythm and rate. Examination of the right breast reveals no palpable mass or nipple discharge. Examination left breast reveals a excellent cosmetic result. There is no dominant mass appreciated in the breast nipple discharge or bleeding.  Lab Findings: Lab Results  Component Value Date   WBC 8.4 09/30/2013   HGB 14.3 10/09/2013   HCT 39.6 09/30/2013   MCV 92.3 09/30/2013   PLT 309 09/30/2013    Radiographic Findings: Mm Diag Breast Tomo Bilateral  09/13/2014   CLINICAL DATA:  History of left lumpectomy 2015. This is the patient's initial post lumpectomy exam.  EXAM: DIGITAL DIAGNOSTIC bilateral MAMMOGRAM WITH 3D TOMOSYNTHESIS AND CAD  COMPARISON:  [facility]  ACR Breast Density Category b: There are scattered areas of fibroglandular density.  FINDINGS: Left lumpectomy change is identified. The round calcification inferior to the lumpectomy site is stable over multiple previous exams and has a benign appearance. Vascular calcifications are noted bilaterally. No new evidence for malignancy in either breast.  Mammographic images were processed with CAD.  IMPRESSION: No evidence for malignancy in either breast. Left lumpectomy changes are now identified.  RECOMMENDATION: Diagnostic mammogram is suggested in 1 year. (Code:DM-B-01Y)  I have discussed the findings and recommendations with the patient. Results were also provided in  writing at the conclusion of the visit. If applicable, a reminder letter will be sent to the patient regarding the next appointment.  BI-RADS CATEGORY  2: Benign.   Electronically Signed   By: Conchita Paris M.D.   On: 09/13/2014 13:33    Impression:  No evidence of recurrence on clinical exam today  Plan:  When necessary follow-up in radiation oncology. The patient will continue close follow-up in medical oncology.  ____________________________________ Blair Promise, MD

## 2014-09-23 NOTE — Telephone Encounter (Signed)
Patient came in as she needed to reschedule her appointment with dr Jarvis Morgan calendar has been printed

## 2014-09-23 NOTE — Progress Notes (Signed)
Angelica Roberts here for follow up.  She reports occasional sharp pains in the lower portion of her left breast.  She also reports occasional soreness under her left arm.  She is not taking anti estrogen therapy.  She reports having "bad reflux" yesterday.  She reports fatigue in the afternoons.   She is taking care of her husband who is bed bound.   The skin on her left breast is intact.    BP 118/52 mmHg  Pulse 75  Temp(Src) 97.4 F (36.3 C) (Oral)  Resp 20  Ht 5\' 3"  (1.6 m)  Wt 135 lb 14.4 oz (61.644 kg)  BMI 24.08 kg/m2

## 2014-11-10 ENCOUNTER — Ambulatory Visit (HOSPITAL_BASED_OUTPATIENT_CLINIC_OR_DEPARTMENT_OTHER): Payer: Medicare HMO | Admitting: Oncology

## 2014-11-10 ENCOUNTER — Telehealth: Payer: Self-pay | Admitting: Oncology

## 2014-11-10 VITALS — BP 123/55 | HR 72 | Temp 97.7°F | Resp 18 | Ht 63.0 in | Wt 137.1 lb

## 2014-11-10 DIAGNOSIS — D0512 Intraductal carcinoma in situ of left breast: Secondary | ICD-10-CM | POA: Diagnosis not present

## 2014-11-10 DIAGNOSIS — C50412 Malignant neoplasm of upper-outer quadrant of left female breast: Secondary | ICD-10-CM

## 2014-11-10 NOTE — Telephone Encounter (Signed)
Appointments made and avs printed for patient °

## 2014-11-10 NOTE — Progress Notes (Signed)
[facility] [facility]  [phone] Fax:(336) [phone]     ID: [name] DOB: July 29, 1933  MR#: [id number]  KGU#:[id number]  Patient Care Team: Donald Prose, MD as PCP - General (Family Medicine) [facility] radiation oncology [place]. Surgery   CHIEF COMPLAINT: Ductal carcinoma in situ  CURRENT TREATMENT: Observation   BREAST CANCER HISTORY: From Dr. Dana Allan original intake note 09/30/2013:  "ANAHIT KLUMB is a 81 y.o. female. Who is seen in the multidisciplinary breast clinic for new diagnosis of DCIS. Patient underwent a screening mammogram that showed suspicious calcifications in the upper-outer quadrant of the left breast. She had a biopsy performed that showed high grade ductal carcinoma in situ with associated necrosis and calcifications. MRI of the breasts showed biopsy changes in the upper outer quadrant of the left breast. No lymph nodes no evidence of any suspicious lesions on the right. Her case was discussed in the multidisciplinary breast conference. She is now seen in the [facility] clinic for discussion of treatment options."  Her subsequent history is as detailed below  INTERVAL HISTORY: Alsha returns today for followup of her breast cancer. The interval history is generally unremarkable. Her husband continues to decline, has had a second stroke, and has a DO NOT RESUSCITATE in place. From her description it sounds to me like he might qualify for hospice--all his primary care physician needs to certify is that he has a 50-50 chance of dying in the next 6 months and that life prolonging therapy is not planned. She will consider that  REVIEW OF SYSTEMS: She has moderate hot flashes and night sweats. These have been ongoing and there are not worse than before. She has mild insomnia problems. She complains of hearing loss, problems with her dentures, some heartburn issues, rare abdominal cramps, and a "spot" in her right axilla she wants me to look  at. A detailed review of systems today was otherwise noncontributory  PAST MEDICAL HISTORY: Past Medical History  Diagnosis Date  . Carotid artery occlusion     Bruit  . Glaucoma   . Postmenopausal HRT (hormone replacement therapy)   . GERD (gastroesophageal reflux disease)   . Osteoporosis   . Allergy     seasonal  . Gait abnormality   . Incontinence of bowel   . Constipation   . Bladder disorder June 2012    Sling  . Kidney stone   . Bone spur of toe     right big toe  . Weakness   . Hearing impaired   . Dyspnea on exertion   . Arthritis     osteoarthritis  . Back pain   . Anemia   . Cancer 02/2004    Esophageal , Skin cancer - basal cell  . Wears dentures     upper  . Wears hearing aid     Bilateral - hearing aids  . Wears partial dentures     partial - lower  . DCIS (ductal carcinoma in situ) of breast     left  . History of DPT vaccination 10/2011    per patient  . Tetanus toxoid vaccination administered 1-4 years ago 10/2011    per patient  . Vaccine for VZV (varicella-zoster virus)   . Malignant neoplasm of breast (female), unspecified site   . Radiation 11/12/13-12/03/13    left breast 42.72 gray    PAST SURGICAL HISTORY: Past Surgical History  Procedure Laterality Date  . Esopheageal cancer  02/2004    Removed   .  Hernial repain  1980  . Hernia repair  1982    corrective surgery  . Incontinence surgery  june 2012  . Cholecystectomy  1995    Gall Bladder  . Foot surgery Right July 2013    Bone spur  . Vaginal hysterectomy  1977  . Breast lumpectomy with needle localization Left 10/09/2013    Procedure: BREAST LUMPECTOMY WITH NEEDLE LOCALIZATION;  Surgeon: Merrie Roof, MD;  Location: [facility];  Service: General;  Laterality: Left;  . Colonoscopy  2012    per patient    FAMILY HISTORY Family History  Problem Relation Age of Onset  . Hernia Mother     Hiatal  . Diabetes Mother   . Heart attack Mother 63  . Stroke Mother 39    . Hypertension Father   . Stroke Father 59  . Diabetes Father   . Hyperlipidemia Father   . Stroke Sister   . Breast cancer Sister 35    TAH/BSO @ 65; currently 53  . Colon cancer Brother 34    lung cancer @ 29; currently 36  . Heart attack Maternal Grandmother   . Hernia Maternal Grandmother   . Stroke Paternal Grandmother 52  . Stroke Paternal Grandfather 29  . Cancer Brother     Esophageal in 82s; deceased at 62; non-smoker  . Cancer Maternal Grandfather     GI cancer in 53s; deceased 53s  . Breast cancer Maternal Aunt     dx 12s; deceased late 27s  . Cancer Paternal Uncle     2 pat uncles with GI cancer in late 70s/80  . Cancer Cousin     2 pat cousins with GI cancer <50yo (sons of uncle with GI cancer)   the patient's father died at the age of 87, in his sleep, with a history of heart disease. The patient's mother died at the age 4. She has had a stroke and heart attack at the age of 60. Both her parents had diabetes. The patient has 3 brothers. One had esophageal cancer but died from a heart attack. A second brother is currently 71. He also has had a heart attack in the past. The third brother, 74, has a history of rheumatoid arthritis and has had both GI and lung cancers. The patient has 2 sisters. One had breast cancer diagnosed at age 75. The other one is 25.  GYNECOLOGIC HISTORY:  No LMP recorded. Patient has had a hysterectomy. Menarche age 12, first live birth age 36, the patient is [place] P3. She underwent hysterectomy but she does not know whether with or without salpingo-oophorectomy at age 54. She did take hormone replacement until 2005 when she developed her esophageal cancer.  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 Her second husband is 69 years old. He is a "semi-invalid." The patient is his primary caregiver. She has 3 children from prior marriages: Karren Burly who lives in [place] and is in business administration, Kathlen Mody who lives in [place] and is says Economist; and Garland Smouse who lives in [place] and is self-employed. The patient has 2 grandchildren. She attends the [employer]: In place; however the patient is not sure her husband, who is elderly, can function as her healthcare power of attorney. On the 12/22/2013 visit I gave the patient appropriate documents to complete and notarize so she can choose a different healthcare  power of attorney at her discretion   HEALTH MAINTENANCE: History  Substance Use Topics  . Smoking status: Never Smoker   . Smokeless tobacco: Never Used  . Alcohol Use: No     Colonoscopy:  PAP:  Bone density:  Lipid panel:  Allergies  Allergen Reactions  . Shellfish Allergy Diarrhea, Nausea Only and Rash  . Iodine     Current Outpatient Prescriptions  Medication Sig Dispense Refill  . b complex vitamins tablet Take 1 tablet by mouth daily.    . Biotin 1000 MCG tablet Take 1,000 mcg by mouth 3 (three) times daily. Hair, skin, nails    . calcium carbonate (OS-CAL) 600 MG TABS Take 333 mg by mouth 2 (two) times daily with a meal.     . Cholecalciferol (VITAMIN D3) 3000 UNITS TABS Take 1,000 mg by mouth. Take 1000 IU daily    . denosumab (PROLIA) 60 MG/ML SOLN injection Inject 60 mg into the skin every 6 (six) months. Administer in upper arm, thigh, or abdomen    . esomeprazole (NEXIUM) 40 MG capsule Take 22.3 mg by mouth once.     . famotidine (PEPCID) 20 MG tablet Take 20 mg by mouth 2 (two) times daily.    Marland Kitchen ibuprofen (ADVIL,MOTRIN) 100 MG tablet Take 100 mg by mouth every 6 (six) hours as needed for fever.    . latanoprost (XALATAN) 0.005 % ophthalmic solution     . loratadine (CLARITIN) 5 MG chewable tablet Chew 5 mg by mouth.    . Multiple Vitamins-Minerals (MULTIVITAMIN PO) Take by mouth.     No current facility-administered medications for this visit.    OBJECTIVE: Elderly white woman in no  acute distress Filed Vitals:   11/10/14 0843  BP: 123/55  Pulse: 72  Temp: 97.7 F (36.5 C)  Resp: 18     Body mass index is 24.29 kg/(m^2).    ECOG FS:0 - Asymptomatic  Sclerae unicteric, pupils round and equal Oropharynx clear and moist-- no thrush or other lesions No cervical or supraclavicular adenopathy Lungs no rales or rhonchi Heart regular rate and rhythm Abd soft, nontender, positive bowel sounds MSK no focal spinal tenderness, no upper extremity lymphedema Neuro: nonfocal, well oriented, appropriate affect Breasts: The right breast is unremarkable. The left breast is status post lumpectomy and radiation. There is no evidence of local recurrence. The left axilla is benign Skin: The spot on the lateral right axillary fat looks like an excoriated insect bite   LAB RESULTS:  CMP     Component Value Date/Time   NA 144 09/30/2013 1159   K 3.7 09/30/2013 1159   CO2 27 09/30/2013 1159   GLUCOSE 105 09/30/2013 1159   BUN 15.0 09/30/2013 1159   CREATININE 0.9 09/30/2013 1159   CALCIUM 9.8 09/30/2013 1159   PROT 7.5 09/30/2013 1159   ALBUMIN 3.8 09/30/2013 1159   AST 21 09/30/2013 1159   ALT 15 09/30/2013 1159   ALKPHOS 74 09/30/2013 1159   BILITOT 0.81 09/30/2013 1159    I No results found for: SPEP  Lab Results  Component Value Date   WBC 8.4 09/30/2013   NEUTROABS 5.6 09/30/2013   HGB 14.3 10/09/2013   HCT 39.6 09/30/2013   MCV 92.3 09/30/2013   PLT 309 09/30/2013      Chemistry      Component Value Date/Time   NA 144 09/30/2013 1159   K 3.7 09/30/2013 1159   CO2 27 09/30/2013 1159   BUN 15.0 09/30/2013 1159  CREATININE 0.9 09/30/2013 1159      Component Value Date/Time   CALCIUM 9.8 09/30/2013 1159   ALKPHOS 74 09/30/2013 1159   AST 21 09/30/2013 1159   ALT 15 09/30/2013 1159   BILITOT 0.81 09/30/2013 1159       No results found for: LABCA2  No components found for: LABCA125  No results for input(s): INR in the last 168  hours.  Urinalysis No results found for: COLORURINE  STUDIES: CLINICAL DATA: History of left lumpectomy 2015. This is the patient's initial post lumpectomy exam.  EXAM: DIGITAL DIAGNOSTIC bilateral MAMMOGRAM WITH 3D TOMOSYNTHESIS AND CAD  COMPARISON: [facility]  ACR Breast Density Category b: There are scattered areas of fibroglandular density.  FINDINGS: Left lumpectomy change is identified. The round calcification inferior to the lumpectomy site is stable over multiple previous exams and has a benign appearance. Vascular calcifications are noted bilaterally. No new evidence for malignancy in either breast.  Mammographic images were processed with CAD.  IMPRESSION: No evidence for malignancy in either breast. Left lumpectomy changes are now identified.  RECOMMENDATION: Diagnostic mammogram is suggested in 1 year. (Code:DM-B-01Y)  I have discussed the findings and recommendations with the patient. Results were also provided in writing at the conclusion of the visit. If applicable, a reminder letter will be sent to the patient regarding the next appointment.  BI-RADS CATEGORY 2: Benign.   Electronically Signed  By: Conchita Paris M.D.  On: 09/13/2014 13:33   ASSESSMENT: 81 y.o. BRCA negative Shea St[facility] [place] woman status post left upper outer quadrant biopsy 09/21/2013 ([id number]) for ductal carcinoma in situ, high-grade, estrogen receptor 100% positive, progesterone receptor 15% positive.  (1) status post left lumpectomy 10/09/2013 showing only atypical lobular hyperplasia, no residual DCIS ([id number])  (2) adjuvant radiation completed 12/03/2013  (3) genetic testing June 2015 was normal and did not reveal a mutation in these genes: ATM, BARD1, BRCA1, BRCA2, BRIP1, CDH1, CHEK2, EPCAM, MLH1, MRE11A, MSH2, MSH6, MUTYH, NBN, NF1, PALB2, PMS2, PTEN, RAD50, RAD51C, RAD51D, SMARCA4, STK11, and TP53  (4) the patient opted against antiestrogen  therapy  PLAN: He'll do is doing terrific from a breast cancer point of view, with no evidence of disease activity per she understands she has an excellent prognosis. We're going to continue to see her on a once a year basis until she completes her 5 years of follow-up.  She is going to investigate whether her husband might qualify for hospice.  She has a good understanding of the overall plan. She agrees with it. She knows the goal of treatment in her case is cure. She will call with any problems that may develop before her next visit here.  Chauncey Cruel, MD   11/10/2014 8:52 AM

## 2015-04-14 DIAGNOSIS — K210 Gastro-esophageal reflux disease with esophagitis: Secondary | ICD-10-CM | POA: Diagnosis not present

## 2015-04-14 DIAGNOSIS — M810 Age-related osteoporosis without current pathological fracture: Secondary | ICD-10-CM | POA: Diagnosis not present

## 2015-04-27 DIAGNOSIS — D0512 Intraductal carcinoma in situ of left breast: Secondary | ICD-10-CM | POA: Diagnosis not present

## 2015-05-30 DIAGNOSIS — H2513 Age-related nuclear cataract, bilateral: Secondary | ICD-10-CM | POA: Diagnosis not present

## 2015-06-03 DIAGNOSIS — R079 Chest pain, unspecified: Secondary | ICD-10-CM | POA: Diagnosis not present

## 2015-06-03 DIAGNOSIS — K219 Gastro-esophageal reflux disease without esophagitis: Secondary | ICD-10-CM | POA: Diagnosis not present

## 2015-06-03 DIAGNOSIS — M810 Age-related osteoporosis without current pathological fracture: Secondary | ICD-10-CM | POA: Diagnosis not present

## 2015-06-03 DIAGNOSIS — R197 Diarrhea, unspecified: Secondary | ICD-10-CM | POA: Diagnosis not present

## 2015-06-03 DIAGNOSIS — Z8501 Personal history of malignant neoplasm of esophagus: Secondary | ICD-10-CM | POA: Diagnosis not present

## 2015-06-03 DIAGNOSIS — I6529 Occlusion and stenosis of unspecified carotid artery: Secondary | ICD-10-CM | POA: Diagnosis not present

## 2015-06-17 DIAGNOSIS — M810 Age-related osteoporosis without current pathological fracture: Secondary | ICD-10-CM | POA: Diagnosis not present

## 2015-06-19 HISTORY — PX: EYE SURGERY: SHX253

## 2015-06-21 DIAGNOSIS — R0789 Other chest pain: Secondary | ICD-10-CM | POA: Diagnosis not present

## 2015-06-21 DIAGNOSIS — R079 Chest pain, unspecified: Secondary | ICD-10-CM | POA: Diagnosis not present

## 2015-06-21 DIAGNOSIS — K219 Gastro-esophageal reflux disease without esophagitis: Secondary | ICD-10-CM | POA: Diagnosis not present

## 2015-06-23 ENCOUNTER — Encounter: Payer: Self-pay | Admitting: Cardiology

## 2015-06-23 DIAGNOSIS — Z8501 Personal history of malignant neoplasm of esophagus: Secondary | ICD-10-CM | POA: Insufficient documentation

## 2015-07-01 DIAGNOSIS — R079 Chest pain, unspecified: Secondary | ICD-10-CM | POA: Diagnosis not present

## 2015-07-22 ENCOUNTER — Encounter: Payer: Self-pay | Admitting: Family

## 2015-07-28 ENCOUNTER — Ambulatory Visit (HOSPITAL_COMMUNITY)
Admission: RE | Admit: 2015-07-28 | Discharge: 2015-07-28 | Disposition: A | Discharge status: Home / Self Care | Payer: Medicare HMO | Source: Ambulatory Visit | Attending: Family | Admitting: Family

## 2015-07-28 ENCOUNTER — Ambulatory Visit (INDEPENDENT_AMBULATORY_CARE_PROVIDER_SITE_OTHER): Payer: Medicare HMO | Admitting: Family

## 2015-07-28 ENCOUNTER — Encounter: Payer: Self-pay | Admitting: Family

## 2015-07-28 VITALS — BP 107/63 | HR 65 | Temp 97.7°F | Resp 14 | Ht 62.5 in | Wt 138.0 lb

## 2015-07-28 DIAGNOSIS — I6523 Occlusion and stenosis of bilateral carotid arteries: Secondary | ICD-10-CM

## 2015-07-28 NOTE — Progress Notes (Signed)
Chief Complaint: Extracranial Carotid Artery Stenosis   History of Present Illness  Angelica Roberts is a 82 y.o. female patient of Dr. Oneida Alar who has known minimal extracranial carotid stenosis. She returns today for carotid artery surveillance.  Patient has not had previous carotid artery intervention.  She has no history of TIA or stroke symptoms.Specifically she denies a history of amaurosis fugax or monocular blindness, unilateral facial drooping, hemiparesis, or receptive or expressive aphasia.  She denies claudication symptoms with walking, denies non healing wounds. Pt states she has bad GERD and has a history of esophogeal cancer. Pt denies New Medical or Surgical History. She mows her large yard every week with a push mower, and walks a mile daily.  Pt Diabetic: No Pt smoker: non-smoker  Pt meds include: Statin : No ASA: No Other anticoagulants/antiplatelets: no    Past Medical History  Diagnosis Date  . Carotid artery occlusion     Bruit  . Glaucoma   . Postmenopausal HRT (hormone replacement therapy)   . GERD (gastroesophageal reflux disease)   . Osteoporosis   . Gait abnormality   . Incontinence of bowel   . Constipation   . Bladder disorder June 2012    Sling  . Kidney stone   . Bone spur of toe     right big toe  . Hearing impaired   . Arthritis     osteoarthritis  . Anemia   . Cancer ([place]) 02/2004    Esophageal , Skin cancer - basal cell  . Wears partial dentures     partial - lower  . DCIS (ductal carcinoma in situ) of breast     left  . Radiation 11/12/13-12/03/13    left breast 42.72 gray    Social History Social History  Substance Use Topics  . Smoking status: Never Smoker   . Smokeless tobacco: Never Used  . Alcohol Use: No    Family History Family History  Problem Relation Age of Onset  . Hernia Mother     Hiatal  . Diabetes Mother   . Heart attack Mother 37  . Stroke Mother 15  . Hypertension Father   . Stroke Father 10   . Diabetes Father   . Hyperlipidemia Father   . Stroke Sister   . Breast cancer Sister 75    TAH/BSO @ 47; currently 57  . Colon cancer Brother 72    lung cancer @ 66; currently 69  . Heart attack Maternal Grandmother   . Hernia Maternal Grandmother   . Stroke Paternal Grandmother 39  . Stroke Paternal Grandfather 50  . Cancer Brother     Esophageal in 5s; deceased at 85; non-smoker  . Cancer Maternal Grandfather     GI cancer in 70s; deceased 81s  . Breast cancer Maternal Aunt     dx 59s; deceased late 54s  . Cancer Paternal Uncle     2 pat uncles with GI cancer in late 70s/80  . Cancer Cousin     2 pat cousins with GI cancer <50yo (sons of uncle with GI cancer)    Surgical History Past Surgical History  Procedure Laterality Date  . Esopheageal cancer  02/2004    Removed   . Hernia repair  1982    corrective surgery  . Incontinence surgery  june 2012  . Cholecystectomy  1995    Gall Bladder  . Foot surgery Right July 2013    Bone spur  . Vaginal hysterectomy  1977  .  Breast lumpectomy with needle localization Left 10/09/2013    Procedure: BREAST LUMPECTOMY WITH NEEDLE LOCALIZATION;  Surgeon: Merrie Roof, MD;  Location: [facility];  Service: General;  Laterality: Left;  . Colonoscopy  2012    per patient    Allergies  Allergen Reactions  . Shellfish Allergy Diarrhea, Nausea Only and Rash  . Iodine     Current Outpatient Prescriptions  Medication Sig Dispense Refill  . b complex vitamins tablet Take 1 tablet by mouth daily.    . Biotin 1000 MCG tablet Take 1,000 mcg by mouth 3 (three) times daily. Hair, skin, nails    . calcium carbonate (OS-CAL) 600 MG TABS Take 333 mg by mouth 2 (two) times daily with a meal.     . Cholecalciferol (VITAMIN D3) 3000 UNITS TABS Take 1,000 mg by mouth. Take 1000 IU daily    . denosumab (PROLIA) 60 MG/ML SOLN injection Inject 60 mg into the skin every 6 (six) months. Administer in upper arm, thigh, or abdomen     . esomeprazole (NEXIUM) 40 MG capsule Take 22.3 mg by mouth once.     . famotidine (PEPCID) 20 MG tablet Take 20 mg by mouth daily.     Marland Kitchen ibuprofen (ADVIL,MOTRIN) 100 MG tablet Take 100 mg by mouth every 6 (six) hours as needed for fever.    . latanoprost (XALATAN) 0.005 % ophthalmic solution     . loratadine (CLARITIN) 5 MG chewable tablet Chew 5 mg by mouth.    . Multiple Vitamins-Minerals (MULTIVITAMIN PO) Take by mouth.     No current facility-administered medications for this visit.    Review of Systems : See HPI for pertinent positives and negatives.  Physical Examination  Filed Vitals:   07/28/15 1521  BP: 107/63  Pulse: 65  Temp: 97.7 F (36.5 C)  Resp: 14  Height: 5' 2.5" (1.588 m)  Weight: 138 lb (62.596 kg)  SpO2: 98%   Body mass index is 24.82 kg/(m^2).  General: WDWN female in NAD GAIT: normal Eyes: PERRLA Pulmonary: CTAB, respirations are non labored.  Cardiac: regular rhythm, no detected murmur.  VASCULAR EXAM Carotid Bruits Left Right   Negative Negative   Radial pulses are 2+ palpable and equal.      LE Pulses LEFT RIGHT   POPLITEAL not palpable  not palpable   POSTERIOR TIBIAL not palpable   palpable    DORSALIS PEDIS  ANTERIOR TIBIAL palpable  palpable     Gastrointestinal: soft, nontender, BS WNL, no r/g,no palpable masses.  Musculoskeletal: No muscle atrophy/wasting. M/S 5/5 throughout, Extremities without ischemic changes.  Neurologic: A&O X 3; Appropriate Affect, sensation is normal, Speech is normal, CN 2-12 intact except has some hearing loss, Pain and light touch intact in extremities, Motor exam as listed above           Non-Invasive Vascular Imaging CAROTID DUPLEX 07/28/2015   Bilateral internal carotid artery velocities suggest <40% stenosis. No  significant change compared to exam of 07/19/14.    Assessment: Angelica Roberts is a 82 y.o. female who has no history of stroke or TIA. Today's carotid duplex suggests <40% stenosis of both extracranial internal carotid arteries.  No significant change compared to exam of 07/19/14.     Plan: Follow-up in 1 year with Carotid Duplex scan.   I discussed in depth with the patient the nature of atherosclerosis, and emphasized the importance of maximal medical management including strict control of blood pressure, blood glucose, and lipid  levels, obtaining regular exercise, and continued cessation of smoking.  The patient is aware that without maximal medical management the underlying atherosclerotic disease process will progress, limiting the benefit of any interventions. The patient was given information about stroke prevention and what symptoms should prompt the patient to seek immediate medical care. Thank you for allowing [place] to participate in this patient's care.  Clemon Chambers, RN, MSN, FNP-C [facility] Office: [phone]  Clinic Physician: Oneida Alar  07/28/2015 3:40 PM

## 2015-07-28 NOTE — Patient Instructions (Signed)
Stroke Prevention Some medical conditions and behaviors are associated with an increased chance of having a stroke. You may prevent a stroke by making healthy choices and managing medical conditions. HOW CAN I REDUCE MY RISK OF HAVING A STROKE?   Stay physically active. Get at least 30 minutes of activity on most or all days.  Do not smoke. It may also be helpful to avoid exposure to secondhand smoke.  Limit alcohol use. Moderate alcohol use is considered to be:  No more than 2 drinks per day for men.  No more than 1 drink per day for nonpregnant women.  Eat healthy foods. This involves:  Eating 5 or more servings of fruits and vegetables a day.  Making dietary changes that address high blood pressure (hypertension), high cholesterol, diabetes, or obesity.  Manage your cholesterol levels.  Making food choices that are high in fiber and low in saturated fat, trans fat, and cholesterol may control cholesterol levels.  Take any prescribed medicines to control cholesterol as directed by your health care provider.  Manage your diabetes.  Controlling your carbohydrate and sugar intake is recommended to manage diabetes.  Take any prescribed medicines to control diabetes as directed by your health care provider.  Control your hypertension.  Making food choices that are low in salt (sodium), saturated fat, trans fat, and cholesterol is recommended to manage hypertension.  Ask your health care provider if you need treatment to lower your blood pressure. Take any prescribed medicines to control hypertension as directed by your health care provider.  If you are 18-39 years of age, have your blood pressure checked every 3-5 years. If you are 40 years of age or older, have your blood pressure checked every year.  Maintain a healthy weight.  Reducing calorie intake and making food choices that are low in sodium, saturated fat, trans fat, and cholesterol are recommended to manage  weight.  Stop drug abuse.  Avoid taking birth control pills.  Talk to your health care provider about the risks of taking birth control pills if you are over 35 years old, smoke, get migraines, or have ever had a blood clot.  Get evaluated for sleep disorders (sleep apnea).  Talk to your health care provider about getting a sleep evaluation if you snore a lot or have excessive sleepiness.  Take medicines only as directed by your health care provider.  For some people, aspirin or blood thinners (anticoagulants) are helpful in reducing the risk of forming abnormal blood clots that can lead to stroke. If you have the irregular heart rhythm of atrial fibrillation, you should be on a blood thinner unless there is a good reason you cannot take them.  Understand all your medicine instructions.  Make sure that other conditions (such as anemia or atherosclerosis) are addressed. SEEK IMMEDIATE MEDICAL CARE IF:   You have sudden weakness or numbness of the face, arm, or leg, especially on one side of the body.  Your face or eyelid droops to one side.  You have sudden confusion.  You have trouble speaking (aphasia) or understanding.  You have sudden trouble seeing in one or both eyes.  You have sudden trouble walking.  You have dizziness.  You have a loss of balance or coordination.  You have a sudden, severe headache with no known cause.  You have new chest pain or an irregular heartbeat. Any of these symptoms may represent a serious problem that is an emergency. Do not wait to see if the symptoms will   go away. Get medical help at once. Call your local emergency services (911 in [place] Do not drive yourself to the hospital.   This information is not intended to replace advice given to you by your health care provider. Make sure you discuss any questions you have with your health care provider.   Document Released: 07/12/2004 Document Revised: 06/25/2014 Document Reviewed:  12/05/2012 Elsevier Interactive Patient Education 2016 Elsevier Inc.  

## 2015-07-29 NOTE — Addendum Note (Signed)
Addended by: Dorthula Rue L on: 07/29/2015 11:27 AM   Modules accepted: Orders

## 2015-08-08 ENCOUNTER — Other Ambulatory Visit: Payer: Self-pay | Admitting: Oncology

## 2015-08-08 DIAGNOSIS — Z853 Personal history of malignant neoplasm of breast: Secondary | ICD-10-CM

## 2015-08-24 DIAGNOSIS — H401431 Capsular glaucoma with pseudoexfoliation of lens, bilateral, mild stage: Secondary | ICD-10-CM | POA: Diagnosis not present

## 2015-09-13 ENCOUNTER — Other Ambulatory Visit: Payer: Self-pay | Admitting: Oncology

## 2015-09-13 DIAGNOSIS — Z853 Personal history of malignant neoplasm of breast: Secondary | ICD-10-CM

## 2015-09-14 ENCOUNTER — Ambulatory Visit
Admission: RE | Admit: 2015-09-14 | Discharge: 2015-09-14 | Disposition: A | Discharge status: Home / Self Care | Payer: Medicare HMO | Source: Ambulatory Visit | Attending: Oncology | Admitting: Oncology

## 2015-09-14 DIAGNOSIS — R928 Other abnormal and inconclusive findings on diagnostic imaging of breast: Secondary | ICD-10-CM | POA: Diagnosis not present

## 2015-09-23 DIAGNOSIS — D0512 Intraductal carcinoma in situ of left breast: Secondary | ICD-10-CM | POA: Diagnosis not present

## 2015-10-01 DIAGNOSIS — S80862A Insect bite (nonvenomous), left lower leg, initial encounter: Secondary | ICD-10-CM | POA: Diagnosis not present

## 2015-10-01 DIAGNOSIS — W57XXXA Bitten or stung by nonvenomous insect and other nonvenomous arthropods, initial encounter: Secondary | ICD-10-CM | POA: Diagnosis not present

## 2015-10-01 DIAGNOSIS — S70361A Insect bite (nonvenomous), right thigh, initial encounter: Secondary | ICD-10-CM | POA: Diagnosis not present

## 2015-10-01 DIAGNOSIS — S70362A Insect bite (nonvenomous), left thigh, initial encounter: Secondary | ICD-10-CM | POA: Diagnosis not present

## 2015-10-04 DIAGNOSIS — S70369A Insect bite (nonvenomous), unspecified thigh, initial encounter: Secondary | ICD-10-CM | POA: Diagnosis not present

## 2015-10-04 DIAGNOSIS — W57XXXA Bitten or stung by nonvenomous insect and other nonvenomous arthropods, initial encounter: Secondary | ICD-10-CM | POA: Diagnosis not present

## 2015-11-10 ENCOUNTER — Ambulatory Visit (HOSPITAL_BASED_OUTPATIENT_CLINIC_OR_DEPARTMENT_OTHER): Payer: Medicare HMO | Admitting: Oncology

## 2015-11-10 ENCOUNTER — Telehealth: Payer: Self-pay | Admitting: Oncology

## 2015-11-10 VITALS — BP 126/56 | HR 70 | Temp 98.0°F | Resp 18 | Ht 62.5 in | Wt 137.9 lb

## 2015-11-10 DIAGNOSIS — Z86000 Personal history of in-situ neoplasm of breast: Secondary | ICD-10-CM | POA: Diagnosis not present

## 2015-11-10 DIAGNOSIS — Z923 Personal history of irradiation: Secondary | ICD-10-CM | POA: Diagnosis not present

## 2015-11-10 DIAGNOSIS — Z170 Estrogen receptor positive status [ER+]: Secondary | ICD-10-CM

## 2015-11-10 DIAGNOSIS — C50412 Malignant neoplasm of upper-outer quadrant of left female breast: Secondary | ICD-10-CM

## 2015-11-10 DIAGNOSIS — D0512 Intraductal carcinoma in situ of left breast: Secondary | ICD-10-CM

## 2015-11-10 NOTE — Progress Notes (Signed)
[facility]  [phone] [phone]     ID: [name] DOB: October 13, 1933  MR#: [id number]  NHA#:[id number]  Patient Care Team: Donald Prose, MD as PCP - General (Family Medicine) [facility] radiation oncology [place]. Surgery   CHIEF COMPLAINT: Ductal carcinoma in situ  CURRENT TREATMENT: Observation   BREAST CANCER HISTORY: From Dr. Dana Allan original intake note 09/30/2013:  "Angelica Roberts is a 82 y.o. female. Who is seen in the multidisciplinary breast clinic for new diagnosis of DCIS. Patient underwent a screening mammogram that showed suspicious calcifications in the upper-outer quadrant of the left breast. She had a biopsy performed that showed high grade ductal carcinoma in situ with associated necrosis and calcifications. MRI of the breasts showed biopsy changes in the upper outer quadrant of the left breast. No lymph nodes no evidence of any suspicious lesions on the right. Her case was discussed in the multidisciplinary breast conference. She is now seen in the [facility] clinic for discussion of treatment options."  Her subsequent history is as detailed below  INTERVAL HISTORY: Shuntay returns today for followup of her noninvasive breast cancer. From a breast cancer point of view she is doing fine. Her main concern is her husband's situation. He continues to decline very slowly. She is considering moving to [place] to live closer to one of their daughters.  REVIEW OF SYSTEMS: She has had multiple tick bites this year. Aside from that a detailed review of systems today was noncontributory  PAST MEDICAL HISTORY: Past Medical History  Diagnosis Date  . Carotid artery occlusion     Bruit  . Glaucoma   . Postmenopausal HRT (hormone replacement therapy)   . GERD (gastroesophageal reflux disease)   . Osteoporosis   . Gait abnormality   . Incontinence of bowel   . Constipation   . Bladder disorder June 2012    Sling  .  Kidney stone   . Bone spur of toe     right big toe  . Hearing impaired   . Arthritis     osteoarthritis  . Anemia   . Cancer ([place]) 02/2004    Esophageal , Skin cancer - basal cell  . Wears partial dentures     partial - lower  . DCIS (ductal carcinoma in situ) of breast     left  . Radiation 11/12/13-12/03/13    left breast 42.72 gray    PAST SURGICAL HISTORY: Past Surgical History  Procedure Laterality Date  . Esopheageal cancer  02/2004    Removed   . Hernia repair  1982    corrective surgery  . Incontinence surgery  june 2012  . Cholecystectomy  1995    Gall Bladder  . Foot surgery Right July 2013    Bone spur  . Vaginal hysterectomy  1977  . Breast lumpectomy with needle localization Left 10/09/2013    Procedure: BREAST LUMPECTOMY WITH NEEDLE LOCALIZATION;  Surgeon: Merrie Roof, MD;  Location: [facility];  Service: General;  Laterality: Left;  . Colonoscopy  2012    per patient    FAMILY HISTORY Family History  Problem Relation Age of Onset  . Hernia Mother     Hiatal  . Diabetes Mother   . Heart attack Mother 42  . Stroke Mother 75  . Hypertension Father   . Stroke Father 28  . Diabetes Father   . Hyperlipidemia Father   . Stroke Sister   . Breast cancer  Sister 2    TAH/BSO @ 58; currently 45  . Colon cancer Brother 18    lung cancer @ 54; currently 64  . Heart attack Maternal Grandmother   . Hernia Maternal Grandmother   . Stroke Paternal Grandmother 65  . Stroke Paternal Grandfather 53  . Cancer Brother     Esophageal in 69s; deceased at 30; non-smoker  . Cancer Maternal Grandfather     GI cancer in 42s; deceased 13s  . Breast cancer Maternal Aunt     dx 69s; deceased late 31s  . Cancer Paternal Uncle     2 pat uncles with GI cancer in late 70s/80  . Cancer Cousin     2 pat cousins with GI cancer <50yo (sons of uncle with GI cancer)   the patient's father died at the age of 52, in his sleep, with a history of heart disease. The  patient's mother died at the age 30. She has had a stroke and heart attack at the age of 51. Both her parents had diabetes. The patient has 3 brothers. One had esophageal cancer but died from a heart attack. A second brother is currently 74. He also has had a heart attack in the past. The third brother, 24, has a history of rheumatoid arthritis and has had both GI and lung cancers. The patient has 2 sisters. One had breast cancer diagnosed at age 57. The other one is 18.  GYNECOLOGIC HISTORY:  No LMP recorded. Patient has had a hysterectomy. Menarche age 75, first live birth age 58, the patient is [place] P3. She underwent hysterectomy but she does not know whether with or without salpingo-oophorectomy at age 68. She did take hormone replacement until 2005 when she developed her esophageal cancer.  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 Her second husband is 64 years old. He is a "semi-invalid." The patient is his primary caregiver. She has 3 children from prior marriages: Karren Burly who lives in [place] and is in business administration, Kathlen Mody who lives in [place] and is says Economist; and Diesha Rostad who lives in [place] and is self-employed. The patient has 2 grandchildren. She attends the [employer]: [employer] place; however the patient is not sure her husband, who is elderly, can function as her healthcare power of attorney. On the 12/22/2013 visit I gave the patient appropriate documents to complete and notarize so she can choose a different healthcare power of attorney at her discretion   HEALTH MAINTENANCE: Social History  Substance Use Topics  . Smoking status: Never Smoker   . Smokeless tobacco: Never Used  . Alcohol Use: No     Colonoscopy:  PAP:  Bone density:  Lipid panel:  Allergies  Allergen Reactions  . Shellfish Allergy Diarrhea, Nausea Only and Rash  . Iodine      Current Outpatient Prescriptions  Medication Sig Dispense Refill  . b complex vitamins tablet Take 1 tablet by mouth daily.    . Biotin 1000 MCG tablet Take 1,000 mcg by mouth 3 (three) times daily. Hair, skin, nails    . calcium carbonate (OS-CAL) 600 MG TABS Take 333 mg by mouth 2 (two) times daily with a meal.     . Cholecalciferol (VITAMIN D3) 3000 UNITS TABS Take 1,000 mg by mouth. Take 1000 IU daily    . denosumab (PROLIA) 60 MG/ML SOLN injection Inject 60 mg into the  skin every 6 (six) months. Administer in upper arm, thigh, or abdomen    . esomeprazole (NEXIUM) 40 MG capsule Take 22.3 mg by mouth once.     . famotidine (PEPCID) 20 MG tablet Take 20 mg by mouth daily.     Marland Kitchen ibuprofen (ADVIL,MOTRIN) 100 MG tablet Take 100 mg by mouth every 6 (six) hours as needed for fever.    . latanoprost (XALATAN) 0.005 % ophthalmic solution     . loratadine (CLARITIN) 5 MG chewable tablet Chew 5 mg by mouth.    . Multiple Vitamins-Minerals (MULTIVITAMIN PO) Take by mouth.     No current facility-administered medications for this visit.    OBJECTIVE: Elderly white womanWho appears well  Filed Vitals:   11/10/15 0837  BP: 126/56  Pulse: 70  Temp: 98 F (36.7 C)  Resp: 18     Body mass index is 24.8 kg/(m^2).    ECOG FS:0 - Asymptomatic  Sclerae unicteric, EOMs intact Oropharynx clear, dentition in good repair No cervical or supraclavicular adenopathy Lungs no rales or rhonchi Heart regular rate and rhythm Abd soft, nontender, positive bowel sounds MSK no focal spinal tenderness, no upper extremity lymphedema Neuro: nonfocal, well oriented, appropriate affect Breasts: The right breast is unremarkable. The left breast is status post lumpectomy and radiation. There is no evidence of local recurrence. The left axilla is benign.   LAB RESULTS:  CMP     Component Value Date/Time   NA 144 09/30/2013 1159   K 3.7 09/30/2013 1159   CO2 27 09/30/2013 1159   GLUCOSE 105 09/30/2013  1159   BUN 15.0 09/30/2013 1159   CREATININE 0.9 09/30/2013 1159   CALCIUM 9.8 09/30/2013 1159   PROT 7.5 09/30/2013 1159   ALBUMIN 3.8 09/30/2013 1159   AST 21 09/30/2013 1159   ALT 15 09/30/2013 1159   ALKPHOS 74 09/30/2013 1159   BILITOT 0.81 09/30/2013 1159    I No results found for: SPEP  Lab Results  Component Value Date   WBC 8.4 09/30/2013   NEUTROABS 5.6 09/30/2013   HGB 14.3 10/09/2013   HCT 39.6 09/30/2013   MCV 92.3 09/30/2013   PLT 309 09/30/2013      Chemistry      Component Value Date/Time   NA 144 09/30/2013 1159   K 3.7 09/30/2013 1159   CO2 27 09/30/2013 1159   BUN 15.0 09/30/2013 1159   CREATININE 0.9 09/30/2013 1159      Component Value Date/Time   CALCIUM 9.8 09/30/2013 1159   ALKPHOS 74 09/30/2013 1159   AST 21 09/30/2013 1159   ALT 15 09/30/2013 1159   BILITOT 0.81 09/30/2013 1159       No results found for: LABCA2  No components found for: LABCA125  No results for input(s): INR in the last 168 hours.  Urinalysis No results found for: COLORURINE  STUDIES: CLINICAL DATA: Patient presents for bilateral diagnostic examination due to a history of a previous malignant left lumpectomy 2015.  EXAM: 2D DIGITAL DIAGNOSTIC BILATERAL MAMMOGRAM WITH CAD AND ADJUNCT TOMO  COMPARISON: Previous exam(s).  ACR Breast Density Category b: There are scattered areas of fibroglandular density.  FINDINGS: Examination demonstrates stable post lumpectomy changes over the posterior third of the upper-outer quadrant of the left breast. Remainder of the exam is unchanged.  Mammographic images were processed with CAD.  IMPRESSION: Stable post lumpectomy changes of the left breast.  RECOMMENDATION: Recommend continued annual bilateral diagnostic mammographic evaluation.  I have discussed the findings and  recommendations with the patient. Results were also provided in writing at the conclusion of the visit. If applicable, a reminder  letter will be sent to the patient regarding the next appointment.  BI-RADS CATEGORY 2: Benign.   Electronically Signed  By: Marin Olp M.D.  On: 09/14/2015 08:03  ASSESSMENT: 82 y.o. BRCA negative Shea [place] woman status post left upper outer quadrant biopsy 09/21/2013 ([id number]) for ductal carcinoma in situ, high-grade, estrogen receptor 100% positive, progesterone receptor 15% positive.  (1) status post left lumpectomy 10/09/2013 showing only atypical lobular hyperplasia, no residual DCIS ([id number])  (2) adjuvant radiation completed 12/03/2013  (3) genetic testing June 2015 was normal and did not reveal a mutation in these genes: ATM, BARD1, BRCA1, BRCA2, BRIP1, CDH1, CHEK2, EPCAM, MLH1, MRE11A, MSH2, MSH6, MUTYH, NBN, NF1, PALB2, PMS2, PTEN, RAD50, RAD51C, RAD51D, SMARCA4, STK11, and TP53  (4) the patient opted against antiestrogen therapy  PLAN: Kaytie is now 2 years out from initial surgery for her non-invasive breast cancer, with no evidence of disease recurrence. This is very favorable.  At this point I feel very comfortable transitioning her to our survivorship program. I discussed this with her in detail. She is very agreeable. She will see our survivorship nurse practitioner in one year and further follow-up will be at her discretion  Of course I will be available to [facility] if she wishes to see me in the future for any reason  As far as breast cancer follow-up is concerned of course all she will need is her yearly mammography and her yearly physician breast exam.  Chauncey Cruel, MD   11/10/2015 9:01 AM

## 2015-11-10 NOTE — Telephone Encounter (Signed)
appt made and avs printed °

## 2015-11-22 DIAGNOSIS — H401431 Capsular glaucoma with pseudoexfoliation of lens, bilateral, mild stage: Secondary | ICD-10-CM | POA: Diagnosis not present

## 2015-11-22 DIAGNOSIS — H04123 Dry eye syndrome of bilateral lacrimal glands: Secondary | ICD-10-CM | POA: Diagnosis not present

## 2015-11-22 DIAGNOSIS — H35373 Puckering of macula, bilateral: Secondary | ICD-10-CM | POA: Diagnosis not present

## 2015-11-22 DIAGNOSIS — H2513 Age-related nuclear cataract, bilateral: Secondary | ICD-10-CM | POA: Diagnosis not present

## 2015-12-12 DIAGNOSIS — H21562 Pupillary abnormality, left eye: Secondary | ICD-10-CM | POA: Diagnosis not present

## 2015-12-12 DIAGNOSIS — H2512 Age-related nuclear cataract, left eye: Secondary | ICD-10-CM | POA: Diagnosis not present

## 2015-12-19 DIAGNOSIS — M810 Age-related osteoporosis without current pathological fracture: Secondary | ICD-10-CM | POA: Diagnosis not present

## 2015-12-28 DIAGNOSIS — Z85828 Personal history of other malignant neoplasm of skin: Secondary | ICD-10-CM | POA: Diagnosis not present

## 2015-12-28 DIAGNOSIS — L821 Other seborrheic keratosis: Secondary | ICD-10-CM | POA: Diagnosis not present

## 2015-12-28 DIAGNOSIS — L814 Other melanin hyperpigmentation: Secondary | ICD-10-CM | POA: Diagnosis not present

## 2015-12-28 DIAGNOSIS — D1801 Hemangioma of skin and subcutaneous tissue: Secondary | ICD-10-CM | POA: Diagnosis not present

## 2016-01-16 DIAGNOSIS — H2511 Age-related nuclear cataract, right eye: Secondary | ICD-10-CM | POA: Diagnosis not present

## 2016-01-23 DIAGNOSIS — H2511 Age-related nuclear cataract, right eye: Secondary | ICD-10-CM | POA: Diagnosis not present

## 2016-02-28 ENCOUNTER — Telehealth: Payer: Self-pay | Admitting: Oncology

## 2016-02-28 NOTE — Telephone Encounter (Signed)
FAXED PT RECORDS TO [facility] [phone]

## 2016-05-01 DIAGNOSIS — R69 Illness, unspecified: Secondary | ICD-10-CM | POA: Diagnosis not present

## 2016-05-02 DIAGNOSIS — D0512 Intraductal carcinoma in situ of left breast: Secondary | ICD-10-CM | POA: Diagnosis not present

## 2016-06-04 ENCOUNTER — Telehealth (HOSPITAL_COMMUNITY): Payer: Self-pay | Admitting: *Deleted

## 2016-06-04 NOTE — Telephone Encounter (Signed)
I explained that current medical guidelines and our protocol suggest follow up of her category of carotid stenosis is every 5 years and we could reschedule her appointment for February for four years from now.  She was upset about this so I explained we would be happy to keep those appointments for her and she did not have to move them out.  The patient then hung up on me.  I called her back to again offer to keep her scheduled appointments and not move them and she said she wanted them moved out.

## 2016-06-05 DIAGNOSIS — H401431 Capsular glaucoma with pseudoexfoliation of lens, bilateral, mild stage: Secondary | ICD-10-CM | POA: Diagnosis not present

## 2016-06-26 DIAGNOSIS — M810 Age-related osteoporosis without current pathological fracture: Secondary | ICD-10-CM | POA: Diagnosis not present

## 2016-06-27 DIAGNOSIS — K219 Gastro-esophageal reflux disease without esophagitis: Secondary | ICD-10-CM | POA: Diagnosis not present

## 2016-06-27 DIAGNOSIS — Z79899 Other long term (current) drug therapy: Secondary | ICD-10-CM | POA: Diagnosis not present

## 2016-06-27 DIAGNOSIS — Z0000 Encounter for general adult medical examination without abnormal findings: Secondary | ICD-10-CM | POA: Diagnosis not present

## 2016-06-27 DIAGNOSIS — J302 Other seasonal allergic rhinitis: Secondary | ICD-10-CM | POA: Diagnosis not present

## 2016-06-27 DIAGNOSIS — Z6823 Body mass index (BMI) 23.0-23.9, adult: Secondary | ICD-10-CM | POA: Diagnosis not present

## 2016-06-27 DIAGNOSIS — M810 Age-related osteoporosis without current pathological fracture: Secondary | ICD-10-CM | POA: Diagnosis not present

## 2016-06-27 DIAGNOSIS — H409 Unspecified glaucoma: Secondary | ICD-10-CM | POA: Diagnosis not present

## 2016-06-29 ENCOUNTER — Other Ambulatory Visit: Payer: Self-pay | Admitting: Family Medicine

## 2016-06-29 DIAGNOSIS — M810 Age-related osteoporosis without current pathological fracture: Secondary | ICD-10-CM

## 2016-07-05 ENCOUNTER — Other Ambulatory Visit: Payer: Medicare HMO

## 2016-07-18 ENCOUNTER — Ambulatory Visit
Admission: RE | Admit: 2016-07-18 | Discharge: 2016-07-18 | Disposition: A | Discharge status: Home / Self Care | Payer: Medicare HMO | Source: Ambulatory Visit | Attending: Family Medicine | Admitting: Family Medicine

## 2016-07-18 DIAGNOSIS — M810 Age-related osteoporosis without current pathological fracture: Secondary | ICD-10-CM

## 2016-07-18 DIAGNOSIS — Z780 Asymptomatic menopausal state: Secondary | ICD-10-CM | POA: Diagnosis not present

## 2016-07-18 DIAGNOSIS — M85831 Other specified disorders of bone density and structure, right forearm: Secondary | ICD-10-CM | POA: Diagnosis not present

## 2016-08-02 ENCOUNTER — Ambulatory Visit: Payer: Medicare HMO | Admitting: Family

## 2016-08-02 ENCOUNTER — Inpatient Hospital Stay (HOSPITAL_COMMUNITY)
Admission: RE | Admit: 2016-08-02 | Discharge: 2016-08-02 | Disposition: A | Discharge status: Home / Self Care | Payer: Medicare HMO | Source: Ambulatory Visit | Attending: Family | Admitting: Family

## 2016-08-02 DIAGNOSIS — I6523 Occlusion and stenosis of bilateral carotid arteries: Secondary | ICD-10-CM

## 2016-08-27 DIAGNOSIS — H401131 Primary open-angle glaucoma, bilateral, mild stage: Secondary | ICD-10-CM | POA: Diagnosis not present

## 2016-08-27 DIAGNOSIS — H11441 Conjunctival cysts, right eye: Secondary | ICD-10-CM | POA: Diagnosis not present

## 2016-09-21 ENCOUNTER — Other Ambulatory Visit: Payer: Self-pay | Admitting: Oncology

## 2016-09-21 DIAGNOSIS — N6320 Unspecified lump in the left breast, unspecified quadrant: Secondary | ICD-10-CM

## 2016-09-26 ENCOUNTER — Ambulatory Visit
Admission: RE | Admit: 2016-09-26 | Discharge: 2016-09-26 | Disposition: A | Discharge status: Home / Self Care | Payer: Medicare HMO | Source: Ambulatory Visit | Attending: Oncology | Admitting: Oncology

## 2016-09-26 DIAGNOSIS — R928 Other abnormal and inconclusive findings on diagnostic imaging of breast: Secondary | ICD-10-CM | POA: Diagnosis not present

## 2016-09-26 DIAGNOSIS — N6320 Unspecified lump in the left breast, unspecified quadrant: Secondary | ICD-10-CM

## 2016-09-26 HISTORY — DX: Personal history of irradiation: Z92.3

## 2016-11-09 ENCOUNTER — Encounter: Payer: Medicare HMO | Admitting: Nurse Practitioner

## 2016-11-13 ENCOUNTER — Ambulatory Visit (HOSPITAL_BASED_OUTPATIENT_CLINIC_OR_DEPARTMENT_OTHER): Payer: Medicare HMO | Admitting: Adult Health

## 2016-11-13 ENCOUNTER — Encounter: Payer: Self-pay | Admitting: Adult Health

## 2016-11-13 VITALS — BP 130/54 | HR 74 | Temp 97.7°F | Resp 18 | Ht 62.5 in | Wt 131.9 lb

## 2016-11-13 DIAGNOSIS — Z170 Estrogen receptor positive status [ER+]: Principal | ICD-10-CM

## 2016-11-13 DIAGNOSIS — M810 Age-related osteoporosis without current pathological fracture: Secondary | ICD-10-CM

## 2016-11-13 DIAGNOSIS — Z8501 Personal history of malignant neoplasm of esophagus: Secondary | ICD-10-CM

## 2016-11-13 DIAGNOSIS — C50412 Malignant neoplasm of upper-outer quadrant of left female breast: Secondary | ICD-10-CM

## 2016-11-13 DIAGNOSIS — Z86000 Personal history of in-situ neoplasm of breast: Secondary | ICD-10-CM

## 2016-11-13 NOTE — Progress Notes (Signed)
CLINIC:  Survivorship   REASON FOR VISIT:  Routine follow-up for history of breast cancer.   BRIEF ONCOLOGIC HISTORY:  Per Dr. Darrall Dears last note:   83 y.o. 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  (1) status post left lumpectomy 10/09/2013 showing only atypical lobular hyperplasia, no residual DCIS ([id number])  (2) adjuvant radiation completed 12/03/2013  (3) genetic testing June 2015 was normal and did not reveal a mutation in these genes: ATM, BARD1, BRCA1, BRCA2, BRIP1, CDH1, CHEK2, EPCAM, MLH1, MRE11A, MSH2, MSH6, MUTYH, NBN, NF1, PALB2, PMS2, PTEN, RAD50, RAD51C, RAD51D, SMARCA4, STK11, and TP53  (4) the patient opted against antiestrogen therapy   INTERVAL HISTORY:  Ms. Kotecki presents to the Survivorship Clinic today for routine follow-up for her history of breast cancer.  Overall, she reports feeling quite well.  She is concerned since she has a h/o esophageal cancer in 2005 and was followed closely and was released last year by Dr. Darnelle Catalan and is worried and wants to make sure she is cancer free.  She continues to follow with Dr. Carolynne Edouard and he sees her every 6 months.  She is up to date with her mammograms.      REVIEW OF SYSTEMS:  Review of Systems  Constitutional: Negative for appetite change, chills, diaphoresis, fatigue, fever and unexpected weight change.  HENT:   Negative for hearing loss and lump/mass.   Eyes: Negative for eye problems and icterus.  Respiratory: Negative for chest tightness, cough and shortness of breath.   Cardiovascular: Negative for chest pain, leg swelling and palpitations.  Gastrointestinal: Negative for abdominal distention, abdominal pain, constipation and diarrhea.  Endocrine: Negative for hot flashes.  Genitourinary: Negative for difficulty urinating.     Musculoskeletal: Negative for arthralgias.  Skin: Negative for itching and rash.  Neurological: Negative for dizziness, extremity weakness and numbness.  Hematological: Negative for adenopathy. Does not bruise/bleed easily.  Psychiatric/Behavioral: Negative for depression. The patient is not nervous/anxious.   Breast: Denies any new nodularity, masses, tenderness, nipple changes, or nipple discharge.        PAST MEDICAL/SURGICAL HISTORY:  Past Medical History:  Diagnosis Date  . Anemia   . Arthritis    osteoarthritis  . Bladder disorder June 2012   Sling  . Bone spur of toe    right big toe  . Cancer (HCC) 02/2004   Esophageal , Skin cancer - basal cell  . Carotid artery occlusion    Bruit  . Constipation   . 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 Gait abnormality   . GERD (gastroesophageal reflux disease)   . Glaucoma   . Hearing impaired   . Incontinence of bowel   . Kidney stone   . Osteoporosis   . Personal history of radiation therapy   . Postmenopausal HRT (hormone replacement therapy)   . Radiation 11/12/13-12/03/13   left breast 42.72 gray  . Wears partial dentures    partial - lower   Past Surgical History:  Procedure Laterality Date  . BREAST BIOPSY Left 09/21/2013  . BREAST LUMPECTOMY Left 10/09/2013  . BREAST LUMPECTOMY WITH NEEDLE LOCALIZATION Left 10/09/2013   Procedure: BREAST LUMPECTOMY WITH NEEDLE LOCALIZATION;  Surgeon: Robyne Askew, MD;  Location: [facility];  Service: General;  Laterality: Left;  . CHOLECYSTECTOMY  1995   Gall Bladder  .  COLONOSCOPY  2012   per patient  . 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 FOOT SURGERY Right July 2013   Bone spur  . HERNIA REPAIR  1982   corrective surgery  . INCONTINENCE SURGERY  june 2012  . VAGINAL HYSTERECTOMY  1977     ALLERGIES:  Allergies  Allergen Reactions  . Shellfish Allergy Diarrhea, Nausea Only and Rash  . Iodine      CURRENT MEDICATIONS:   Outpatient Encounter Prescriptions as of 11/13/2016  Medication Sig Note  . b complex vitamins tablet Take 1 tablet by mouth daily.   . Biotin 1000 MCG tablet Take 1,000 mcg by mouth 3 (three) times daily. Hair, skin, nails   . calcium carbonate (OS-CAL) 600 MG TABS Take 333 mg by mouth 2 (two) times daily with a meal.    . Cholecalciferol (VITAMIN D3) 3000 UNITS TABS Take 1,000 mg by mouth. Take 1000 IU daily   . denosumab (PROLIA) 60 MG/ML SOLN injection Inject 60 mg into the skin every 6 (six) months. Administer in upper arm, thigh, or abdomen   . esomeprazole (NEXIUM) 40 MG capsule Take 22.3 mg by mouth once.    . famotidine (PEPCID) 20 MG tablet Take 20 mg by mouth daily.    Marland Kitchen ibuprofen (ADVIL,MOTRIN) 100 MG tablet Take 100 mg by mouth every 6 (six) hours as needed for fever.   . latanoprost (XALATAN) 0.005 % ophthalmic solution  09/23/2014: Received from: External Pharmacy  . loratadine (CLARITIN) 5 MG chewable tablet Chew 5 mg by mouth. 01/07/2014: Received from: [facility]  . Multiple Vitamins-Minerals (MULTIVITAMIN PO) Take by mouth. 01/07/2014: Received from: [facility]   No facility-administered encounter medications on file as of 11/13/2016.      ONCOLOGIC FAMILY HISTORY:  Family History  Problem Relation Age of Onset  . Hernia Mother        Hiatal  . Diabetes Mother   . Heart attack Mother 75  . Stroke Mother 22  . Hypertension Father   . Stroke Father 45  . Diabetes Father   . Hyperlipidemia Father   . Stroke Sister   . Breast cancer Sister 29       TAH/BSO @ 26; currently 64  . Colon cancer Brother 44       lung cancer @ 50; currently 45  . Heart attack Maternal Grandmother   . Hernia Maternal Grandmother   . Stroke Paternal Grandmother 31  . Stroke Paternal Grandfather 60  . Cancer Brother        Esophageal in 30s; deceased at 33; non-smoker  . Cancer Maternal Grandfather        GI cancer in 80s; deceased 36s  . Breast cancer Maternal Aunt        dx 70s;  deceased late 38s  . Cancer Paternal Uncle        2 pat uncles with GI cancer in late 70s/80  . Cancer Cousin        2 pat cousins with GI cancer <50yo (sons of uncle with GI cancer)    GENETIC COUNSELING/TESTING: Testing was normal and did not reveal a mutation in these genes. The genes tested were ATM, BARD1, BRCA1, BRCA2, BRIP1, CDH1, CHEK2, EPCAM, MLH1, MRE11A, MSH2, MSH6, MUTYH, NBN, NF1, PALB2, PMS2, PTEN, RAD50, RAD51C, RAD51D, SMARCA4, STK11, and TP53.  SOCIAL HISTORY:  KINA SHIFFMAN is married and lives and [place]  She has 7 children  and they live in [place], [place], [place], [place], [place], and [place]. [place].  Ms. Firebaugh is currently retired.  She denies any current or history of tobacco, alcohol, or illicit drug use.     PHYSICAL EXAMINATION:  Vital Signs: Vitals:   11/13/16 1450  BP: (!) 130/54  Pulse: 74  Resp: 18  Temp: 97.7 F (36.5 C)   Filed Weights   11/13/16 1450  Weight: 131 lb 14.4 oz (59.8 kg)   General: Well-nourished, well-appearing female in no acute distress.  Unaccompanied today.   HEENT: Head is normocephalic.  Pupils equal and reactive to light. Conjunctivae clear without exudate.  Sclerae anicteric. Oral mucosa is pink, moist.  Oropharynx is pink without lesions or erythema.  Lymph: No cervical, supraclavicular, or infraclavicular lymphadenopathy noted on palpation.  Cardiovascular: Regular rate and rhythm.Marland Kitchen Respiratory: Clear to auscultation bilaterally. Chest expansion symmetric; breathing non-labored.  Breast Exam:  -Left breast: No appreciable masses on palpation. No skin redness, thickening, or peau d'orange appearance; no nipple retraction or nipple discharge; mild distortion in symmetry at previous lumpectomy site well healed scar without erythema or nodularity.  -Right breast: No appreciable masses on palpation. No skin redness, thickening, or peau d'orange appearance; no nipple retraction  or nipple discharge; -Axilla: No axillary adenopathy bilaterally.  GI: Abdomen soft and round; non-tender, non-distended. Bowel sounds normoactive. No hepatosplenomegaly.   GU: Deferred.  Neuro: No focal deficits. Steady gait.  Psych: Mood and affect normal and appropriate for situation.  Extremities: No edema. Skin: Warm and dry.  LABORATORY DATA:  None for this visit   DIAGNOSTIC IMAGING:  Most recent mammogram:      ASSESSMENT AND PLAN:  Ms.. Carnevale is a pleasant 83 y.o. female with history of Stage 0 left breast DCIS, ER+/PR+/HER2-, diagnosed in 09/2013, treated with lumpectomy and adjuvant radiation therapy.  She presents to the Survivorship Clinic for surveillance and routine follow-up.   1. History of breast cancer:  Ms. Molitor is currently clinically and radiographically without evidence of disease or recurrence of breast cancer. She will be due for mammogram in 09/2017; orders placed today.  I suggested she see Dr. Marlou Starks in 6 months, and I would see her in one year in an alternating fashion until she got to 5 years.  She says that she is more comfortable with this approach.  I encouraged her to call me with any questions or concerns before her next visit at the cancer center, and I would be happy to see her sooner, if needed.    2. Bone health:  Given Ms. Palm age, history of breast cancer, she is at risk for bone demineralization. Her last DEXA scan was on 07/18/2016 and was consistent with osteoporosis.  She is receiving Prolia for this every 6 months.  In the meantime, she was encouraged to increase her consumption of foods rich in calcium, as well as increase her weight-bearing activities.  She was given education on specific food and activities to promote bone health.  3. Cancer screening:  Due to Ms. Digman history and her age, she should receive screening for skin cancers. She was encouraged to follow-up with her PCP for appropriate cancer screenings.   4. Health  maintenance and wellness promotion: Ms. Hoelzer was encouraged to consume 5-7 servings of fruits and vegetables per day. She was also encouraged to engage in moderate to vigorous exercise for 30 minutes per day most days of the week. She was instructed to limit her alcohol consumption and continue to  abstain from tobacco use.    Dispo:  -Follow up with Dr. Marlou Starks in 6 months -Return to cancer center in one year for LTS follow up   A total of (30) minutes of face-to-face time was spent with this patient with greater than 50% of that time in counseling and care-coordination.   Gardenia Phlegm, NP Survivorship Program [facility] [phone]   Note: PRIMARY CARE PROVIDER Donald Prose, [place] [phone]

## 2016-11-19 DIAGNOSIS — N952 Postmenopausal atrophic vaginitis: Secondary | ICD-10-CM | POA: Diagnosis not present

## 2016-11-19 DIAGNOSIS — Z86000 Personal history of in-situ neoplasm of breast: Secondary | ICD-10-CM | POA: Diagnosis not present

## 2016-11-19 DIAGNOSIS — M5431 Sciatica, right side: Secondary | ICD-10-CM | POA: Diagnosis not present

## 2016-11-19 DIAGNOSIS — M810 Age-related osteoporosis without current pathological fracture: Secondary | ICD-10-CM | POA: Diagnosis not present

## 2016-11-19 DIAGNOSIS — N904 Leukoplakia of vulva: Secondary | ICD-10-CM | POA: Diagnosis not present

## 2016-11-19 DIAGNOSIS — Z1389 Encounter for screening for other disorder: Secondary | ICD-10-CM | POA: Diagnosis not present

## 2016-11-27 DIAGNOSIS — H401131 Primary open-angle glaucoma, bilateral, mild stage: Secondary | ICD-10-CM | POA: Diagnosis not present

## 2016-12-26 DIAGNOSIS — D1801 Hemangioma of skin and subcutaneous tissue: Secondary | ICD-10-CM | POA: Diagnosis not present

## 2016-12-26 DIAGNOSIS — L821 Other seborrheic keratosis: Secondary | ICD-10-CM | POA: Diagnosis not present

## 2016-12-26 DIAGNOSIS — Z85828 Personal history of other malignant neoplasm of skin: Secondary | ICD-10-CM | POA: Diagnosis not present

## 2016-12-26 DIAGNOSIS — L570 Actinic keratosis: Secondary | ICD-10-CM | POA: Diagnosis not present

## 2016-12-26 DIAGNOSIS — L814 Other melanin hyperpigmentation: Secondary | ICD-10-CM | POA: Diagnosis not present

## 2016-12-26 DIAGNOSIS — D225 Melanocytic nevi of trunk: Secondary | ICD-10-CM | POA: Diagnosis not present

## 2016-12-26 DIAGNOSIS — B078 Other viral warts: Secondary | ICD-10-CM | POA: Diagnosis not present

## 2017-01-07 DIAGNOSIS — M810 Age-related osteoporosis without current pathological fracture: Secondary | ICD-10-CM | POA: Diagnosis not present

## 2017-01-08 ENCOUNTER — Other Ambulatory Visit: Payer: Self-pay | Admitting: Family Medicine

## 2017-01-08 DIAGNOSIS — M545 Low back pain: Secondary | ICD-10-CM

## 2017-01-08 DIAGNOSIS — M25551 Pain in right hip: Secondary | ICD-10-CM

## 2017-01-19 ENCOUNTER — Ambulatory Visit
Admission: RE | Admit: 2017-01-19 | Discharge: 2017-01-19 | Disposition: A | Discharge status: Home / Self Care | Payer: Medicare HMO | Source: Ambulatory Visit | Attending: Family Medicine | Admitting: Family Medicine

## 2017-01-19 DIAGNOSIS — M48061 Spinal stenosis, lumbar region without neurogenic claudication: Secondary | ICD-10-CM | POA: Diagnosis not present

## 2017-01-19 DIAGNOSIS — M25551 Pain in right hip: Secondary | ICD-10-CM

## 2017-01-19 DIAGNOSIS — M545 Low back pain: Secondary | ICD-10-CM

## 2017-01-29 ENCOUNTER — Other Ambulatory Visit: Payer: Self-pay | Admitting: Family Medicine

## 2017-01-29 DIAGNOSIS — N1330 Unspecified hydronephrosis: Secondary | ICD-10-CM

## 2017-01-31 ENCOUNTER — Inpatient Hospital Stay
Admission: RE | Admit: 2017-01-31 | Discharge: 2017-01-31 | Disposition: A | Discharge status: Home / Self Care | Payer: Medicare HMO | Source: Ambulatory Visit | Attending: Family Medicine | Admitting: Family Medicine

## 2017-02-04 DIAGNOSIS — M5106 Intervertebral disc disorders with myelopathy, lumbar region: Secondary | ICD-10-CM | POA: Diagnosis not present

## 2017-02-04 DIAGNOSIS — M48061 Spinal stenosis, lumbar region without neurogenic claudication: Secondary | ICD-10-CM | POA: Diagnosis not present

## 2017-02-12 ENCOUNTER — Ambulatory Visit
Admission: RE | Admit: 2017-02-12 | Discharge: 2017-02-12 | Disposition: A | Discharge status: Home / Self Care | Payer: Medicare HMO | Source: Ambulatory Visit | Attending: Family Medicine | Admitting: Family Medicine

## 2017-02-12 DIAGNOSIS — N1330 Unspecified hydronephrosis: Secondary | ICD-10-CM | POA: Diagnosis not present

## 2017-02-14 DIAGNOSIS — N201 Calculus of ureter: Secondary | ICD-10-CM | POA: Diagnosis not present

## 2017-02-14 DIAGNOSIS — R8271 Bacteriuria: Secondary | ICD-10-CM | POA: Diagnosis not present

## 2017-02-19 ENCOUNTER — Other Ambulatory Visit: Payer: Self-pay | Admitting: Urology

## 2017-02-20 ENCOUNTER — Encounter (HOSPITAL_COMMUNITY): Payer: Self-pay | Admitting: *Deleted

## 2017-02-23 NOTE — Discharge Instructions (Signed)
Lithotripsy, Care After °This sheet gives you information about how to care for yourself after your procedure. Your health care provider may also give you more specific instructions. If you have problems or questions, contact your health care provider. °What can I expect after the procedure? °After the procedure, it is common to have: °· Some blood in your urine. This should only last for a few days. °· Soreness in your back, sides, or upper abdomen for a few days. °· Blotches or bruises on your back where the pressure wave entered the skin. °· Pain, discomfort, or nausea when pieces (fragments) of the kidney stone move through the tube that carries urine from the kidney to the bladder (ureter). Stone fragments may pass soon after the procedure, but they may continue to pass for up to 4-8 weeks. °? If you have severe pain or nausea, contact your health care provider. This may be caused by a large stone that was not broken up, and this may mean that you need more treatment. °· Some pain or discomfort during urination. °· Some pain or discomfort in the lower abdomen or (in men) at the base of the penis. ° °Follow these instructions at home: °Medicines °· Take over-the-counter and prescription medicines only as told by your health care provider. °· If you were prescribed an antibiotic medicine, take it as told by your health care provider. Do not stop taking the antibiotic even if you start to feel better. °· Do not drive for 24 hours if you were given a medicine to help you relax (sedative). °· Do not drive or use heavy machinery while taking prescription pain medicine. °Eating and drinking °· Drink enough water and fluids to keep your urine clear or pale yellow. This helps any remaining pieces of the stone to pass. It can also help prevent new stones from forming. °· Eat plenty of fresh fruits and vegetables. °· Follow instructions from your health care provider about eating and drinking restrictions. You may be  instructed: °? To reduce how much salt (sodium) you eat or drink. Check ingredients and nutrition facts on packaged foods and beverages. °? To reduce how much meat you eat. °· Eat the recommended amount of calcium for your age and gender. Ask your health care provider how much calcium you should have. °General instructions °· Get plenty of rest. °· Most people can resume normal activities 1-2 days after the procedure. Ask your health care provider what activities are safe for you. °· If directed, strain all urine through the strainer that was provided by your health care provider. °? Keep all fragments for your health care provider to see. Any stones that are found may be sent to a medical lab for examination. The stone may be as small as a grain of salt. °· Keep all follow-up visits as told by your health care provider. This is important. °Contact a health care provider if: °· You have pain that is severe or does not get better with medicine. °· You have nausea that is severe or does not go away. °· You have blood in your urine longer than your health care provider told you to expect. °· You have more blood in your urine. °· You have pain during urination that does not go away. °· You urinate more frequently than usual and this does not go away. °· You develop a rash or any other possible signs of an allergic reaction. °Get help right away if: °· You have severe pain in   your back, sides, or upper abdomen. °· You have severe pain while urinating. °· Your urine is very dark red. °· You have blood in your stool (feces). °· You cannot pass any urine at all. °· You feel a strong urge to urinate after emptying your bladder. °· You have a fever or chills. °· You develop shortness of breath, difficulty breathing, or chest pain. °· You have severe nausea that leads to persistent vomiting. °· You faint. °Summary °· After this procedure, it is common to have some pain, discomfort, or nausea when pieces (fragments) of the  kidney stone move through the tube that carries urine from the kidney to the bladder (ureter). If this pain or nausea is severe, however, you should contact your health care provider. °· Most people can resume normal activities 1-2 days after the procedure. Ask your health care provider what activities are safe for you. °· Drink enough water and fluids to keep your urine clear or pale yellow. This helps any remaining pieces of the stone to pass, and it can help prevent new stones from forming. °· If directed, strain your urine and keep all fragments for your health care provider to see. Fragments or stones may be as small as a grain of salt. °· Get help right away if you have severe pain in your back, sides, or upper abdomen or have severe pain while urinating. °This information is not intended to replace advice given to you by your health care provider. Make sure you discuss any questions you have with your health care provider. °Document Released: 06/24/2007 Document Revised: 04/25/2016 Document Reviewed: 04/25/2016 °Elsevier Interactive Patient Education © 2017 [employer] ° °

## 2017-02-23 NOTE — H&P (Signed)
HPI: Angelica Roberts is a 83 year-old female patient with a right ureteral stone.  The problem is on the right side. She first stated noticing pain on 01/19/2017. She is not currently having flank pain, back pain, groin pain, nausea, vomiting, fever or chills.   The patient was having right foot or leg pain that radiated up toward her buttocks and underwent an MRI of the L-spine January 19, 2017. This revealed moderate right hydronephrosis. Therefore she underwent a CT scan February 12, 2017 which revealed a 10 mm stone at the right UPJ (visible on scout, SSD 10 cm, HU 1404). No progression of stone. She currently has no back pain or flank pain. UA shows greater than 60 white blood cells, 20-40 rbc's for colostomy reversal and many bacteria. She's well and staying hydrated but has a lot of acid reflux.   She has occasional frequency, urgency and weak stream and after she double voids she does well for several hours.     ALLERGIES: Iodine SOLN Shellfish    MEDICATIONS: Nexium  Calcium-Magnesium-Zinc  Claritin  Collagen Support  Latanoprost 0.005 % drops  Pepcid 40 mg tablet 2 Oral  Prolia 60 mg/ml syringe  Vitamin B Complex capsule 0 Oral  Vitamin D3     GU PSH: Bladder Repair - 2010 ESWL - 2010 Hysterectomy Unilat SO - 2010 Sling - 2012, 2010      Wann Notes: Colon Surgery   NON-GU PSH: Breast lumpectomy, Left Cataract surgery Cholecystectomy (open) - 2010 Dental Surgery Procedure, dental implant Rectocele Repair - 2010 Stomach Surgery Procedure - 2010    GU PMH: Chronic cystitis (w/o hematuria), Chronic cystitis - 2014 Mixed incontinence, Urge and stress incontinence - 2014 Renal calculus, Kidney stone on right side - 2014    NON-GU PMH: Personal history of other diseases of the nervous system and sense organs, History of glaucoma - 2014 Age-related osteoporosis without current pathological fracture Diaphragmatic hernia without obstruction or gangrene Gastric ulcer, unspecified  as acute or chronic, without hemorrhage or perforation GERD Glaucoma Intraductal carcinoma in situ of left breast Personal history of malignant neoplasm of esophagus Vitamin D deficiency, unspecified    FAMILY HISTORY: Acute Myocardial Infarction - Brother, Brother Breast Cancer - Sister Deceased - Mother, Father Diabetes - Son, Father, Sister, Mother Glaucoma - Father, Mother Heart Disease - Mother, Father, Brother Hypertension - Father Hyperthyroidism - Mother Lung Cancer - Brother nephrolithiasis - Son Prostate Cancer - Brother   SOCIAL HISTORY: Marital Status: Married Preferred Language: English; Ethnicity: Not Hispanic Or Latino; Race: White Current Smoking Status: Patient has never smoked.   Tobacco Use Assessment Completed: Used Tobacco in last 30 days? Has never drank.  Drinks 3 caffeinated drinks per day. Patient's occupation is/was Retired.     Notes: 2 sons, 1 daughter   REVIEW OF SYSTEMS:    GU Review Female:   Patient reports hard to postpone urination, burning /pain with urination, get up at night to urinate, leakage of urine, and stream starts and stops. Patient denies frequent urination, trouble starting your stream, have to strain to urinate, and being pregnant.  Gastrointestinal (Upper):   Patient reports nausea, vomiting, and indigestion/ heartburn.   Gastrointestinal (Lower):   Patient reports diarrhea and constipation.   Constitutional:   Patient denies fever, night sweats, weight loss, and fatigue.  Skin:   Patient reports itching. Patient denies skin rash/ lesion.  Eyes:   Patient reports blurred vision. Patient denies double vision.  Ears/ Nose/ Throat:   Patient reports  sinus problems. Patient denies sore throat.  Hematologic/Lymphatic:   Patient denies swollen glands and easy bruising.  Cardiovascular:   Patient denies leg swelling and chest pains.  Respiratory:   Patient reports shortness of breath. Patient denies cough.  Endocrine:   Patient  reports excessive thirst.   Musculoskeletal:   Patient reports back pain. Patient denies joint pain.  Neurological:   Patient denies headaches and dizziness.  Psychologic:   Patient denies depression and anxiety.   VITAL SIGNS:    Weight 135 lb / 61.23 kg  Height 62 in / 157.48 cm  BP 127/65 mmHg  Pulse 78 /min  BMI 24.7 kg/m   MULTI-SYSTEM PHYSICAL EXAMINATION:    Constitutional: Well-nourished. No physical deformities. Normally developed. Good grooming.  Neck: Neck symmetrical, not swollen. Normal tracheal position.  Respiratory: No labored breathing, no use of accessory muscles.   Cardiovascular: Normal temperature, normal extremity pulses, no swelling, no varicosities.  Lymphatic: No enlargement of neck, axillae, groin.  Skin: No paleness, no jaundice, no cyanosis. No lesion, no ulcer, no rash.  Neurologic / Psychiatric: Oriented to time, oriented to place, oriented to person. No depression, no anxiety, no agitation.  Gastrointestinal: No mass, no tenderness, no rigidity, non obese abdomen. No CVAT.   Eyes: Normal conjunctivae. Normal eyelids.  Ears, Nose, Mouth, and Throat: Left ear no scars, no lesions, no masses. Right ear no scars, no lesions, no masses. Nose no scars, no lesions, no masses. Normal hearing. Normal lips.  Musculoskeletal: Normal gait and station of head and neck.     PAST DATA REVIEWED:  Source Of History:  Patient   PROCEDURES:          Urinalysis w/Scope Dipstick Dipstick Cont'd Micro  Color: Yellow Bilirubin: Neg WBC/hpf: >60/hpf  Appearance: Cloudy Ketones: Neg RBC/hpf: 20 - 40/hpf  Specific Gravity: 1.025 Blood: 3+ Bacteria: Many (>50/hpf)  pH: 5.5 Protein: 1+ Cystals: NS (Not Seen)  Glucose: Neg Urobilinogen: 0.2 Casts: NS (Not Seen)    Nitrites: Positive Trichomonas: Not Present    Leukocyte Esterase: 3+ Mucous: Present      Epithelial Cells: 0 - 5/hpf      Yeast: NS (Not Seen)      Sperm: Not Present    ASSESSMENT/PLAN:   right UPJ stone -  I discussed with the patient the nature risks and benefits of continued stone passage, off label use of alpha blockers, shockwave lithotripsy or ureteroscopy. All questions answered. She'll proceed with ESWL, right. We discussed return precautions.  A urine culture was performed on 02/14/17 which revealed E. coli sensitive to all cephalosporins.  She was started on Keflex at that time.           Medications New Meds: Keflex 500 mg capsule 1 capsule PO BID   #20  0 Refill(s)

## 2017-02-25 ENCOUNTER — Encounter (HOSPITAL_COMMUNITY)
Admission: RE | Disposition: A | Discharge status: Home / Self Care | Payer: Self-pay | Source: Ambulatory Visit | Attending: Urology

## 2017-02-25 ENCOUNTER — Ambulatory Visit (HOSPITAL_COMMUNITY): Payer: Medicare HMO

## 2017-02-25 ENCOUNTER — Ambulatory Visit (HOSPITAL_COMMUNITY)
Admission: RE | Admit: 2017-02-25 | Discharge: 2017-02-25 | Disposition: A | Discharge status: Home / Self Care | Payer: Medicare HMO | Source: Ambulatory Visit | Attending: Urology | Admitting: Urology

## 2017-02-25 ENCOUNTER — Encounter (HOSPITAL_COMMUNITY): Payer: Self-pay | Admitting: *Deleted

## 2017-02-25 DIAGNOSIS — Z803 Family history of malignant neoplasm of breast: Secondary | ICD-10-CM | POA: Insufficient documentation

## 2017-02-25 DIAGNOSIS — N200 Calculus of kidney: Secondary | ICD-10-CM | POA: Insufficient documentation

## 2017-02-25 DIAGNOSIS — K219 Gastro-esophageal reflux disease without esophagitis: Secondary | ICD-10-CM | POA: Diagnosis not present

## 2017-02-25 DIAGNOSIS — Z8249 Family history of ischemic heart disease and other diseases of the circulatory system: Secondary | ICD-10-CM | POA: Diagnosis not present

## 2017-02-25 DIAGNOSIS — Z833 Family history of diabetes mellitus: Secondary | ICD-10-CM | POA: Diagnosis not present

## 2017-02-25 DIAGNOSIS — Z8501 Personal history of malignant neoplasm of esophagus: Secondary | ICD-10-CM | POA: Insufficient documentation

## 2017-02-25 DIAGNOSIS — Z79899 Other long term (current) drug therapy: Secondary | ICD-10-CM | POA: Diagnosis not present

## 2017-02-25 DIAGNOSIS — Z801 Family history of malignant neoplasm of trachea, bronchus and lung: Secondary | ICD-10-CM | POA: Diagnosis not present

## 2017-02-25 DIAGNOSIS — Z8042 Family history of malignant neoplasm of prostate: Secondary | ICD-10-CM | POA: Insufficient documentation

## 2017-02-25 DIAGNOSIS — N201 Calculus of ureter: Secondary | ICD-10-CM

## 2017-02-25 HISTORY — PX: EXTRACORPOREAL SHOCK WAVE LITHOTRIPSY: SHX1557

## 2017-02-25 HISTORY — DX: Personal history of urinary calculi: Z87.442

## 2017-02-25 HISTORY — DX: Personal history of other diseases of the digestive system: Z87.19

## 2017-02-25 SURGERY — LITHOTRIPSY, ESWL
Anesthesia: LOCAL | Laterality: Right

## 2017-02-25 MED ORDER — SODIUM CHLORIDE 0.9 % IV SOLN
INTRAVENOUS | Status: DC
  Administered 2017-02-25: 07:00:00 via INTRAVENOUS

## 2017-02-25 MED ORDER — CIPROFLOXACIN HCL 500 MG PO TABS
500.0000 mg | ORAL_TABLET | ORAL | Status: AC
  Administered 2017-02-25: 500 mg via ORAL
  Filled 2017-02-25: qty 1

## 2017-02-25 MED ORDER — DIPHENHYDRAMINE HCL 25 MG PO CAPS
25.0000 mg | ORAL_CAPSULE | ORAL | Status: AC
  Administered 2017-02-25: 25 mg via ORAL
  Filled 2017-02-25: qty 1

## 2017-02-25 MED ORDER — DIAZEPAM 5 MG PO TABS
10.0000 mg | ORAL_TABLET | ORAL | Status: AC
  Administered 2017-02-25: 10 mg via ORAL
  Filled 2017-02-25: qty 2

## 2017-02-25 MED ORDER — TAMSULOSIN HCL 0.4 MG PO CAPS: 0.4000 mg | ORAL_CAPSULE | ORAL | 0 refills | Status: DC

## 2017-02-25 MED ORDER — OXYCODONE HCL 10 MG PO TABS: 10.0000 mg | ORAL_TABLET | ORAL | 0 refills | Status: DC | PRN

## 2017-02-25 NOTE — Op Note (Signed)
See [facility] OP note scanned into chart. Also because of the size, density, location and other factors that cannot be anticipated I feel this will likely be a staged procedure. This fact supersedes any indication in the scanned [facility] operative note to the contrary.  

## 2017-02-26 ENCOUNTER — Other Ambulatory Visit: Payer: Self-pay

## 2017-02-26 DIAGNOSIS — R918 Other nonspecific abnormal finding of lung field: Secondary | ICD-10-CM

## 2017-02-26 DIAGNOSIS — Z170 Estrogen receptor positive status [ER+]: Principal | ICD-10-CM

## 2017-02-26 DIAGNOSIS — C50412 Malignant neoplasm of upper-outer quadrant of left female breast: Secondary | ICD-10-CM

## 2017-03-07 DIAGNOSIS — H401131 Primary open-angle glaucoma, bilateral, mild stage: Secondary | ICD-10-CM | POA: Diagnosis not present

## 2017-03-11 DIAGNOSIS — M48061 Spinal stenosis, lumbar region without neurogenic claudication: Secondary | ICD-10-CM | POA: Diagnosis not present

## 2017-03-19 DIAGNOSIS — N201 Calculus of ureter: Secondary | ICD-10-CM | POA: Diagnosis not present

## 2017-03-20 DIAGNOSIS — N201 Calculus of ureter: Secondary | ICD-10-CM | POA: Diagnosis not present

## 2017-04-06 DIAGNOSIS — R69 Illness, unspecified: Secondary | ICD-10-CM | POA: Diagnosis not present

## 2017-06-20 DIAGNOSIS — N201 Calculus of ureter: Secondary | ICD-10-CM | POA: Diagnosis not present

## 2017-06-27 DIAGNOSIS — H11441 Conjunctival cysts, right eye: Secondary | ICD-10-CM | POA: Diagnosis not present

## 2017-06-27 DIAGNOSIS — H401131 Primary open-angle glaucoma, bilateral, mild stage: Secondary | ICD-10-CM | POA: Diagnosis not present

## 2017-07-01 DIAGNOSIS — J309 Allergic rhinitis, unspecified: Secondary | ICD-10-CM | POA: Diagnosis not present

## 2017-07-01 DIAGNOSIS — Z791 Long term (current) use of non-steroidal anti-inflammatories (NSAID): Secondary | ICD-10-CM | POA: Diagnosis not present

## 2017-07-01 DIAGNOSIS — H409 Unspecified glaucoma: Secondary | ICD-10-CM | POA: Diagnosis not present

## 2017-07-01 DIAGNOSIS — Z823 Family history of stroke: Secondary | ICD-10-CM | POA: Diagnosis not present

## 2017-07-01 DIAGNOSIS — K219 Gastro-esophageal reflux disease without esophagitis: Secondary | ICD-10-CM | POA: Diagnosis not present

## 2017-07-01 DIAGNOSIS — Z809 Family history of malignant neoplasm, unspecified: Secondary | ICD-10-CM | POA: Diagnosis not present

## 2017-07-01 DIAGNOSIS — Z803 Family history of malignant neoplasm of breast: Secondary | ICD-10-CM | POA: Diagnosis not present

## 2017-07-01 DIAGNOSIS — M810 Age-related osteoporosis without current pathological fracture: Secondary | ICD-10-CM | POA: Diagnosis not present

## 2017-07-01 DIAGNOSIS — Z79899 Other long term (current) drug therapy: Secondary | ICD-10-CM | POA: Diagnosis not present

## 2017-07-01 DIAGNOSIS — R69 Illness, unspecified: Secondary | ICD-10-CM | POA: Diagnosis not present

## 2017-07-16 DIAGNOSIS — M810 Age-related osteoporosis without current pathological fracture: Secondary | ICD-10-CM | POA: Diagnosis not present

## 2017-09-26 DIAGNOSIS — H401131 Primary open-angle glaucoma, bilateral, mild stage: Secondary | ICD-10-CM | POA: Diagnosis not present

## 2017-10-31 DIAGNOSIS — L299 Pruritus, unspecified: Secondary | ICD-10-CM | POA: Diagnosis not present

## 2017-10-31 DIAGNOSIS — S30860D Insect bite (nonvenomous) of lower back and pelvis, subsequent encounter: Secondary | ICD-10-CM | POA: Diagnosis not present

## 2017-10-31 DIAGNOSIS — W57XXXA Bitten or stung by nonvenomous insect and other nonvenomous arthropods, initial encounter: Secondary | ICD-10-CM | POA: Diagnosis not present

## 2017-11-12 DIAGNOSIS — S30860A Insect bite (nonvenomous) of lower back and pelvis, initial encounter: Secondary | ICD-10-CM | POA: Diagnosis not present

## 2017-11-12 DIAGNOSIS — Z1389 Encounter for screening for other disorder: Secondary | ICD-10-CM | POA: Diagnosis not present

## 2017-11-14 ENCOUNTER — Encounter: Payer: Self-pay | Admitting: Adult Health

## 2017-11-14 ENCOUNTER — Telehealth: Payer: Self-pay | Admitting: Adult Health

## 2017-11-14 ENCOUNTER — Inpatient Hospital Stay: Payer: Medicare HMO | Attending: Adult Health | Admitting: Adult Health

## 2017-11-14 VITALS — BP 104/48 | HR 77 | Temp 98.9°F | Resp 17 | Ht 62.0 in | Wt 134.3 lb

## 2017-11-14 DIAGNOSIS — I6529 Occlusion and stenosis of unspecified carotid artery: Secondary | ICD-10-CM | POA: Diagnosis not present

## 2017-11-14 DIAGNOSIS — Z923 Personal history of irradiation: Secondary | ICD-10-CM | POA: Diagnosis not present

## 2017-11-14 DIAGNOSIS — D649 Anemia, unspecified: Secondary | ICD-10-CM | POA: Diagnosis not present

## 2017-11-14 DIAGNOSIS — K219 Gastro-esophageal reflux disease without esophagitis: Secondary | ICD-10-CM

## 2017-11-14 DIAGNOSIS — M1990 Unspecified osteoarthritis, unspecified site: Secondary | ICD-10-CM

## 2017-11-14 DIAGNOSIS — Z800 Family history of malignant neoplasm of digestive organs: Secondary | ICD-10-CM

## 2017-11-14 DIAGNOSIS — Z79899 Other long term (current) drug therapy: Secondary | ICD-10-CM | POA: Diagnosis not present

## 2017-11-14 DIAGNOSIS — Z87442 Personal history of urinary calculi: Secondary | ICD-10-CM

## 2017-11-14 DIAGNOSIS — Z86000 Personal history of in-situ neoplasm of breast: Secondary | ICD-10-CM

## 2017-11-14 DIAGNOSIS — M810 Age-related osteoporosis without current pathological fracture: Secondary | ICD-10-CM | POA: Diagnosis not present

## 2017-11-14 DIAGNOSIS — Z85828 Personal history of other malignant neoplasm of skin: Secondary | ICD-10-CM

## 2017-11-14 DIAGNOSIS — Z170 Estrogen receptor positive status [ER+]: Secondary | ICD-10-CM

## 2017-11-14 DIAGNOSIS — C50412 Malignant neoplasm of upper-outer quadrant of left female breast: Secondary | ICD-10-CM

## 2017-11-14 NOTE — Telephone Encounter (Signed)
Gave patient AVs and calendar of upcoming May 2020 appointments.  °

## 2017-11-14 NOTE — Progress Notes (Signed)
CLINIC:  Survivorship   REASON FOR VISIT:  Routine follow-up for history of breast cancer.   BRIEF ONCOLOGIC HISTORY:   Per Dr. Virgie Dad last note:   84 y.o.BRCA negative Angelica Roberts woman status post left upper outer quadrant biopsy 09/21/2013 ([id number]) for ductal carcinoma in situ, high-grade, estrogen receptor 100% positive, progesterone receptor 15% positive.  (1) status post left lumpectomy 10/09/2013 showing only atypical lobular hyperplasia, no residual DCIS ([id number])  (2) adjuvant radiation completed 12/03/2013  (3) genetic testing June 2015 was normal and did not reveal a mutation in these genes: ATM, BARD1, BRCA1, BRCA2, BRIP1, CDH1, CHEK2, EPCAM, MLH1, MRE11A, MSH2, MSH6, MUTYH, NBN, NF1, PALB2, PMS2, PTEN, RAD50, RAD51C, RAD51D, SMARCA4, STK11, and TP53  (4) the patient opted against antiestrogen therapy   INTERVAL HISTORY:  Angelica Roberts presents to the [facility] today for routine follow-up for her history of breast cancer.  Overall, she reports feeling quite well.  She is doing well health wise.  She tells me that she has a left breast secretion from her inverted nipple from time to time.  This has been discussed with Dr. Marlou Starks who says that it is normal.  She sees Dr. Marlou Starks every November.  Angelica Roberts is due to schedule her mammogram.      REVIEW OF SYSTEMS:  Review of Systems  Constitutional: Negative for appetite change, chills, fatigue, fever and unexpected weight change.  HENT:   Negative for hearing loss, lump/mass and mouth sores.   Eyes: Negative for eye problems and icterus.  Respiratory: Negative for chest tightness, cough and shortness of breath.   Cardiovascular: Negative for chest pain, leg swelling and palpitations.  Gastrointestinal: Negative for abdominal distention, abdominal pain, constipation, diarrhea, nausea and vomiting.  Endocrine: Negative for hot flashes.  Skin: Negative for itching and rash.  Neurological: Negative for  dizziness, extremity weakness, headaches and numbness.  Hematological: Negative for adenopathy. Does not bruise/bleed easily.  Psychiatric/Behavioral: Negative for depression. The patient is not nervous/anxious.   Breast: Denies any new nodularity, masses, tenderness, nipple changes, or nipple discharge.       PAST MEDICAL/SURGICAL HISTORY:  Past Medical History:  Diagnosis Date  . Anemia   . Arthritis    osteoarthritis  . Bladder disorder June 2012   Sling  . Bone spur of toe    right big toe  . Cancer ([place]) 02/2004   Esophageal , Skin cancer - basal cell  . Carotid artery occlusion    Bruit  . Constipation   . 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 Gait abnormality   . GERD (gastroesophageal reflux disease)   . Glaucoma   . Hearing impaired   . History of hiatal hernia   . History of kidney stones   . Incontinence of bowel   . Kidney stone   . Osteoporosis   . Personal history of radiation therapy   . Postmenopausal HRT (hormone replacement therapy)   . Radiation 11/12/13-12/03/13   left breast 42.72 gray  . Wears partial dentures    partial - lower   Past Surgical History:  Procedure Laterality Date  . BREAST BIOPSY Left 09/21/2013  . BREAST LUMPECTOMY Left 10/09/2013  . BREAST LUMPECTOMY WITH NEEDLE LOCALIZATION Left 10/09/2013   Procedure: BREAST LUMPECTOMY WITH NEEDLE LOCALIZATION;  Surgeon: Merrie Roof, MD;  Location: [place];  Service: General;  Laterality: Left;  . CHOLECYSTECTOMY  1995   Gall Bladder  . COLONOSCOPY  2012   per patient  . dental     dental implants  . 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 EXTRACORPOREAL SHOCK WAVE LITHOTRIPSY Right 02/25/2017   Procedure: RIGHT EXTRACORPOREAL SHOCK WAVE LITHOTRIPSY (ESWL);  Surgeon: Kathie Rhodes, MD;  Location: [facility];  Service: Urology;  Laterality: Right;  . EYE SURGERY Bilateral 2017   cataracts  . FOOT SURGERY Right July 2013   Bone spur  . HERNIA REPAIR   1982   corrective surgery  . INCONTINENCE SURGERY  june 2012  . LITHOTRIPSY    . VAGINAL HYSTERECTOMY  1977     ALLERGIES:  Allergies  Allergen Reactions  . Shellfish Allergy Diarrhea, Nausea Only and Rash  . Iodine      CURRENT MEDICATIONS:  Outpatient Encounter Medications as of 11/14/2017  Medication Sig Note  . b complex vitamins tablet Take 1 tablet by mouth daily.   . Biotin 1000 MCG tablet Take 1,000 mcg by mouth 3 (three) times daily. Hair, skin, nails   . calcium carbonate (OS-CAL) 600 MG TABS Take 333 mg by mouth 2 (two) times daily with a meal.    . cephALEXin (KEFLEX) 500 MG capsule Take 500 mg by mouth 2 (two) times daily.   . Cholecalciferol (VITAMIN D3) 3000 UNITS TABS Take 1,000 mg by mouth. Take 1000 IU daily   . denosumab (PROLIA) 60 MG/ML SOLN injection Inject 60 mg into the skin every 6 (six) months. Administer in upper arm, thigh, or abdomen   . esomeprazole (NEXIUM) 40 MG capsule Take 22.3 mg by mouth once.    . famotidine (PEPCID) 20 MG tablet Take 20 mg by mouth daily.    Marland Kitchen ibuprofen (ADVIL,MOTRIN) 100 MG tablet Take 100 mg by mouth every 6 (six) hours as needed for fever.   . latanoprost (XALATAN) 0.005 % ophthalmic solution  09/23/2014: Received from: External Pharmacy  . loratadine (CLARITIN) 5 MG chewable tablet Chew 5 mg by mouth. 01/07/2014: Received from: [facility]  . Multiple Vitamins-Minerals (MULTIVITAMIN PO) Take by mouth. 01/07/2014: Received from: [place]  . [DISCONTINUED] Oxycodone HCl 10 MG TABS Take 1 tablet (10 mg total) by mouth every 4 (four) hours as needed. (Patient not taking: Reported on 11/14/2017)   . [DISCONTINUED] tamsulosin (FLOMAX) 0.4 MG CAPS capsule Take 1 capsule (0.4 mg total) by mouth daily after supper. (Patient not taking: Reported on 11/14/2017)    No facility-administered encounter medications on file as of 11/14/2017.      ONCOLOGIC FAMILY HISTORY:  Family History  Problem Relation Age of Onset  . Hernia Mother         Hiatal  . Diabetes Mother   . Heart attack Mother 28  . Stroke Mother 3  . Hypertension Father   . Stroke Father 71  . Diabetes Father   . Hyperlipidemia Father   . Stroke Sister   . Breast cancer Sister 33       TAH/BSO @ 11; currently 56  . Colon cancer Brother 47       lung cancer @ 58; currently 30  . Heart attack Maternal Grandmother   . Hernia Maternal Grandmother   . Stroke Paternal Grandmother 33  . Stroke Paternal Grandfather 103  . Cancer Brother        Esophageal in 38s; deceased at 9; non-smoker  . Cancer Maternal Grandfather        GI cancer in 64s; deceased 16s  . Breast cancer Maternal Aunt  dx 20s; deceased late 4s  . Cancer Paternal Uncle        2 pat uncles with GI cancer in late 70s/80  . Cancer Cousin        2 pat cousins with GI cancer <50yo (sons of uncle with GI cancer)    SOCIAL HISTORY:  Social History   Socioeconomic History  . Marital status: Married    Spouse name: Not on file  . Number of children: 3  . Years of education: Not on file  . Highest education level: Not on file  Occupational History  . Not on file  Social Needs  . Financial resource strain: Not on file  . Food insecurity:    Worry: Not on file    Inability: Not on file  . Transportation needs:    Medical: Not on file    Non-medical: Not on file  Tobacco Use  . Smoking status: Never Smoker  . Smokeless tobacco: Never Used  Substance and Sexual Activity  . Alcohol use: No    Alcohol/week: 0.0 oz  . Drug use: No  . Sexual activity: Not Currently  Lifestyle  . Physical activity:    Days per week: Not on file    Minutes per session: Not on file  . Stress: Not on file  Relationships  . Social connections:    Talks on phone: Not on file    Gets together: Not on file    Attends religious service: Not on file    Active member of club or organization: Not on file    Attends meetings of clubs or organizations: Not on file    Relationship status: Not on  file  . Intimate partner violence:    Fear of current or ex partner: Not on file    Emotionally abused: Not on file    Physically abused: Not on file    Forced sexual activity: Not on file  Other Topics Concern  . Not on file  Social History Narrative  . Not on file      PHYSICAL EXAMINATION:  Vital Signs: Vitals:   11/14/17 1341  BP: (!) 104/48  Pulse: 77  Resp: 17  Temp: 98.9 F (37.2 C)  SpO2: 99%   Filed Weights   11/14/17 1341  Weight: 134 lb 4.8 oz (60.9 kg)   General: Well-nourished, well-appearing female in no acute distress.  Unaccompanied today.   HEENT: Head is normocephalic.  Pupils equal and reactive to light. Conjunctivae clear without exudate.  Sclerae anicteric. Oral mucosa is pink, moist.  Oropharynx is pink without lesions or erythema.  Lymph: No cervical, supraclavicular, or infraclavicular lymphadenopathy noted on palpation.  Cardiovascular: Regular rate and rhythm.Marland Kitchen Respiratory: Clear to auscultation bilaterally. Chest expansion symmetric; breathing non-labored.  Breast Exam:  -Left breast: No appreciable masses on palpation. No skin redness, thickening, or peau d'orange appearance; + nipple retraction (since surgery) no nipple discharge; mild distortion in symmetry at previous lumpectomy site well healed scar without erythema or nodularity.  -Right breast: No appreciable masses on palpation. No skin redness, thickening, or peau d'orange appearance; no nipple retraction or nipple discharge;  -Axilla: No axillary adenopathy bilaterally.  GI: Abdomen soft and round; non-tender, non-distended. Bowel sounds normoactive. No hepatosplenomegaly.   GU: Deferred.  Neuro: No focal deficits. Steady gait.  Psych: Mood and affect normal and appropriate for situation.  MSK: No focal spinal tenderness to palpation, full range of motion in bilateral upper extremities Extremities: No edema. Skin: Warm and dry.  LABORATORY DATA:  None for this visit   DIAGNOSTIC  IMAGING:  Most recent mammogram:      ASSESSMENT AND PLAN:  Ms.. Roberts is a pleasant 84 y.o. female with history of Stage 0 left breast DCIS, ER+/PR+, diagnosed in 09/2013, treated with lumpectomy, adjuvant radiation therapy.  [facility] for surveillance and routine follow-up.   1. History of breast cancer:  Angelica Roberts is currently clinically and radiographically without evidence of disease or recurrence of breast cancer. She is due for mammogram.  Angelica Roberts will see Dr. Marlou Starks every November and myself every May to complete 5 years of follow up in 10/2018.  I encouraged her to call me with any questions or concerns before her next visit at the cancer center, and I would be happy to see her sooner, if needed.    2. Cancer screening:  Due to Angelica Roberts history and her age, she should receive screening for skin cancers, colon cancer, and gynecologic cancers. She was encouraged to follow-up with her PCP for appropriate cancer screenings.   3. Health maintenance and wellness promotion: Angelica Roberts was encouraged to consume 5-7 servings of fruits and vegetables per day. She was also encouraged to engage in moderate to vigorous exercise for 30 minutes per day most days of the week. She was instructed to limit her alcohol consumption and continue to abstain from tobacco use.      Dispo:  -Return to cancer center in one year for LTS follow up   A total of (30) minutes of face-to-face time was spent with this patient with greater than 50% of that time in counseling and care-coordination.   Gardenia Phlegm, NP Survivorship Program [facility] [phone]   Note: PRIMARY CARE PROVIDER Donald Prose, [place] [phone]

## 2017-11-20 ENCOUNTER — Other Ambulatory Visit: Payer: Self-pay | Admitting: Adult Health

## 2017-11-20 ENCOUNTER — Ambulatory Visit
Admission: RE | Admit: 2017-11-20 | Discharge: 2017-11-20 | Disposition: A | Discharge status: Home / Self Care | Payer: Medicare HMO | Source: Ambulatory Visit | Attending: Adult Health | Admitting: Adult Health

## 2017-11-20 ENCOUNTER — Ambulatory Visit
Admission: RE | Admit: 2017-11-20 | Discharge: 2017-11-20 | Disposition: A | Discharge status: Home / Self Care | Payer: Medicare HMO | Source: Ambulatory Visit | Attending: Oncology | Admitting: Oncology

## 2017-11-20 ENCOUNTER — Other Ambulatory Visit: Payer: Self-pay | Admitting: Oncology

## 2017-11-20 DIAGNOSIS — Z170 Estrogen receptor positive status [ER+]: Secondary | ICD-10-CM

## 2017-11-20 DIAGNOSIS — N6489 Other specified disorders of breast: Secondary | ICD-10-CM | POA: Diagnosis not present

## 2017-11-20 DIAGNOSIS — C50412 Malignant neoplasm of upper-outer quadrant of left female breast: Secondary | ICD-10-CM

## 2017-11-20 DIAGNOSIS — N6310 Unspecified lump in the right breast, unspecified quadrant: Secondary | ICD-10-CM

## 2017-11-20 DIAGNOSIS — R922 Inconclusive mammogram: Secondary | ICD-10-CM | POA: Diagnosis not present

## 2017-11-20 DIAGNOSIS — R918 Other nonspecific abnormal finding of lung field: Secondary | ICD-10-CM

## 2017-11-25 ENCOUNTER — Other Ambulatory Visit: Payer: Self-pay | Admitting: Adult Health

## 2017-11-25 DIAGNOSIS — N6310 Unspecified lump in the right breast, unspecified quadrant: Secondary | ICD-10-CM

## 2018-01-31 DIAGNOSIS — H401131 Primary open-angle glaucoma, bilateral, mild stage: Secondary | ICD-10-CM | POA: Diagnosis not present

## 2018-02-05 ENCOUNTER — Other Ambulatory Visit: Payer: Self-pay | Admitting: *Deleted

## 2018-02-05 ENCOUNTER — Telehealth: Payer: Self-pay | Admitting: Oncology

## 2018-02-05 DIAGNOSIS — R918 Other nonspecific abnormal finding of lung field: Secondary | ICD-10-CM

## 2018-02-05 NOTE — Telephone Encounter (Signed)
Left message for patient regarding upcoming aug appts per 8/21 sch message.

## 2018-02-07 ENCOUNTER — Ambulatory Visit (HOSPITAL_COMMUNITY): Admission: RE | Admit: 2018-02-07 | Payer: Medicare HMO | Source: Ambulatory Visit

## 2018-02-07 ENCOUNTER — Telehealth: Payer: Self-pay | Admitting: Oncology

## 2018-02-07 ENCOUNTER — Other Ambulatory Visit: Payer: Medicare HMO

## 2018-02-07 NOTE — Telephone Encounter (Signed)
Spoke to PT regarding upcoming aug appts per 8/23 sch message.  °

## 2018-02-10 ENCOUNTER — Ambulatory Visit (HOSPITAL_COMMUNITY)
Admission: RE | Admit: 2018-02-10 | Discharge: 2018-02-10 | Disposition: A | Discharge status: Home / Self Care | Payer: Medicare HMO | Source: Ambulatory Visit | Attending: Oncology | Admitting: Oncology

## 2018-02-10 ENCOUNTER — Inpatient Hospital Stay: Payer: Medicare HMO | Attending: Adult Health

## 2018-02-10 DIAGNOSIS — Z170 Estrogen receptor positive status [ER+]: Secondary | ICD-10-CM | POA: Insufficient documentation

## 2018-02-10 DIAGNOSIS — M810 Age-related osteoporosis without current pathological fracture: Secondary | ICD-10-CM | POA: Diagnosis not present

## 2018-02-10 DIAGNOSIS — I2510 Atherosclerotic heart disease of native coronary artery without angina pectoris: Secondary | ICD-10-CM | POA: Insufficient documentation

## 2018-02-10 DIAGNOSIS — C50412 Malignant neoplasm of upper-outer quadrant of left female breast: Secondary | ICD-10-CM | POA: Insufficient documentation

## 2018-02-10 DIAGNOSIS — I700 Atherosclerosis of aorta: Secondary | ICD-10-CM | POA: Insufficient documentation

## 2018-02-10 DIAGNOSIS — R918 Other nonspecific abnormal finding of lung field: Secondary | ICD-10-CM

## 2018-02-10 LAB — CMP (CANCER CENTER ONLY)
ALBUMIN: 3.6 g/dL (ref 3.5–5.0)
ALK PHOS: 70 U/L (ref 38–126)
ALT: 13 U/L (ref 0–44)
AST: 18 U/L (ref 15–41)
Anion gap: 9 (ref 5–15)
BUN: 17 mg/dL (ref 8–23)
CALCIUM: 10.2 mg/dL (ref 8.9–10.3)
CO2: 29 mmol/L (ref 22–32)
CREATININE: 0.98 mg/dL (ref 0.44–1.00)
Chloride: 105 mmol/L (ref 98–111)
GFR, Est AFR Am: >60 mL/min (ref >60)
GFR, Estimated: 53 mL/min — ABNORMAL LOW (ref >60)
GLUCOSE: 109 mg/dL — AB (ref 70–99)
Potassium: 3.7 mmol/L (ref 3.5–5.1)
SODIUM: 143 mmol/L (ref 135–145)
Total Bilirubin: 0.8 mg/dL (ref 0.3–1.2)
Total Protein: 6.9 g/dL (ref 6.5–8.1)

## 2018-02-10 MED ORDER — IOHEXOL 300 MG/ML  SOLN
75.0000 mL | Freq: Once | INTRAMUSCULAR | Status: AC | PRN
  Administered 2018-02-10: 75 mL via INTRAVENOUS

## 2018-02-18 ENCOUNTER — Telehealth: Payer: Self-pay | Admitting: *Deleted

## 2018-02-18 NOTE — Telephone Encounter (Signed)
ROI faxed to [employer]; release [phone]

## 2018-03-07 DIAGNOSIS — I6521 Occlusion and stenosis of right carotid artery: Secondary | ICD-10-CM | POA: Diagnosis not present

## 2018-03-07 DIAGNOSIS — Z86000 Personal history of in-situ neoplasm of breast: Secondary | ICD-10-CM | POA: Diagnosis not present

## 2018-03-07 DIAGNOSIS — M810 Age-related osteoporosis without current pathological fracture: Secondary | ICD-10-CM | POA: Diagnosis not present

## 2018-03-07 DIAGNOSIS — R42 Dizziness and giddiness: Secondary | ICD-10-CM | POA: Diagnosis not present

## 2018-03-07 DIAGNOSIS — I700 Atherosclerosis of aorta: Secondary | ICD-10-CM | POA: Diagnosis not present

## 2018-03-07 DIAGNOSIS — Z23 Encounter for immunization: Secondary | ICD-10-CM | POA: Diagnosis not present

## 2018-03-14 DIAGNOSIS — I700 Atherosclerosis of aorta: Secondary | ICD-10-CM | POA: Diagnosis not present

## 2018-03-14 DIAGNOSIS — I2510 Atherosclerotic heart disease of native coronary artery without angina pectoris: Secondary | ICD-10-CM | POA: Diagnosis not present

## 2018-03-14 DIAGNOSIS — Z136 Encounter for screening for cardiovascular disorders: Secondary | ICD-10-CM | POA: Diagnosis not present

## 2018-05-12 DIAGNOSIS — D2262 Melanocytic nevi of left upper limb, including shoulder: Secondary | ICD-10-CM | POA: Diagnosis not present

## 2018-05-12 DIAGNOSIS — Z85828 Personal history of other malignant neoplasm of skin: Secondary | ICD-10-CM | POA: Diagnosis not present

## 2018-05-12 DIAGNOSIS — D485 Neoplasm of uncertain behavior of skin: Secondary | ICD-10-CM | POA: Diagnosis not present

## 2018-05-12 DIAGNOSIS — L814 Other melanin hyperpigmentation: Secondary | ICD-10-CM | POA: Diagnosis not present

## 2018-05-26 ENCOUNTER — Other Ambulatory Visit: Payer: Self-pay | Admitting: Adult Health

## 2018-05-26 ENCOUNTER — Ambulatory Visit
Admission: RE | Admit: 2018-05-26 | Discharge: 2018-05-26 | Disposition: A | Discharge status: Home / Self Care | Payer: Medicare HMO | Source: Ambulatory Visit | Attending: Oncology | Admitting: Oncology

## 2018-05-26 ENCOUNTER — Ambulatory Visit
Admission: RE | Admit: 2018-05-26 | Discharge: 2018-05-26 | Disposition: A | Discharge status: Home / Self Care | Payer: Medicare HMO | Source: Ambulatory Visit | Attending: Adult Health | Admitting: Adult Health

## 2018-05-26 DIAGNOSIS — R922 Inconclusive mammogram: Secondary | ICD-10-CM | POA: Diagnosis not present

## 2018-05-26 DIAGNOSIS — N6310 Unspecified lump in the right breast, unspecified quadrant: Secondary | ICD-10-CM

## 2018-05-26 DIAGNOSIS — N6011 Diffuse cystic mastopathy of right breast: Secondary | ICD-10-CM | POA: Diagnosis not present

## 2018-06-18 HISTORY — PX: BREAST LUMPECTOMY: SHX2

## 2018-06-24 DIAGNOSIS — H401131 Primary open-angle glaucoma, bilateral, mild stage: Secondary | ICD-10-CM | POA: Diagnosis not present

## 2018-08-14 DIAGNOSIS — M810 Age-related osteoporosis without current pathological fracture: Secondary | ICD-10-CM | POA: Diagnosis not present

## 2018-08-20 DIAGNOSIS — K219 Gastro-esophageal reflux disease without esophagitis: Secondary | ICD-10-CM | POA: Diagnosis not present

## 2018-08-20 DIAGNOSIS — R1032 Left lower quadrant pain: Secondary | ICD-10-CM | POA: Diagnosis not present

## 2018-08-20 DIAGNOSIS — Z87442 Personal history of urinary calculi: Secondary | ICD-10-CM | POA: Diagnosis not present

## 2018-08-20 DIAGNOSIS — R194 Change in bowel habit: Secondary | ICD-10-CM | POA: Diagnosis not present

## 2018-08-20 DIAGNOSIS — Z853 Personal history of malignant neoplasm of breast: Secondary | ICD-10-CM | POA: Diagnosis not present

## 2018-08-20 DIAGNOSIS — Z8501 Personal history of malignant neoplasm of esophagus: Secondary | ICD-10-CM | POA: Diagnosis not present

## 2018-08-20 DIAGNOSIS — N3000 Acute cystitis without hematuria: Secondary | ICD-10-CM | POA: Diagnosis not present

## 2018-08-26 ENCOUNTER — Other Ambulatory Visit: Payer: Self-pay | Admitting: Urgent Care

## 2018-08-26 ENCOUNTER — Encounter (HOSPITAL_COMMUNITY): Payer: Self-pay | Admitting: *Deleted

## 2018-08-26 ENCOUNTER — Other Ambulatory Visit: Payer: Self-pay

## 2018-08-26 ENCOUNTER — Ambulatory Visit
Admission: RE | Admit: 2018-08-26 | Discharge: 2018-08-26 | Disposition: A | Discharge status: Home / Self Care | Payer: Medicare HMO | Source: Ambulatory Visit | Attending: Urgent Care | Admitting: Urgent Care

## 2018-08-26 ENCOUNTER — Inpatient Hospital Stay (HOSPITAL_COMMUNITY)
Admission: EM | Admit: 2018-08-26 | Discharge: 2018-08-28 | DRG: 389 | Disposition: A | Discharge status: Home / Self Care | Payer: Medicare HMO | Attending: Internal Medicine | Admitting: Internal Medicine

## 2018-08-26 DIAGNOSIS — Z833 Family history of diabetes mellitus: Secondary | ICD-10-CM

## 2018-08-26 DIAGNOSIS — E785 Hyperlipidemia, unspecified: Secondary | ICD-10-CM | POA: Diagnosis present

## 2018-08-26 DIAGNOSIS — R194 Change in bowel habit: Secondary | ICD-10-CM | POA: Diagnosis not present

## 2018-08-26 DIAGNOSIS — N390 Urinary tract infection, site not specified: Secondary | ICD-10-CM | POA: Diagnosis present

## 2018-08-26 DIAGNOSIS — D0510 Intraductal carcinoma in situ of unspecified breast: Secondary | ICD-10-CM | POA: Diagnosis present

## 2018-08-26 DIAGNOSIS — Z923 Personal history of irradiation: Secondary | ICD-10-CM

## 2018-08-26 DIAGNOSIS — K56601 Complete intestinal obstruction, unspecified as to cause: Secondary | ICD-10-CM | POA: Diagnosis not present

## 2018-08-26 DIAGNOSIS — H409 Unspecified glaucoma: Secondary | ICD-10-CM | POA: Diagnosis present

## 2018-08-26 DIAGNOSIS — M1990 Unspecified osteoarthritis, unspecified site: Secondary | ICD-10-CM | POA: Diagnosis present

## 2018-08-26 DIAGNOSIS — B029 Zoster without complications: Secondary | ICD-10-CM | POA: Diagnosis not present

## 2018-08-26 DIAGNOSIS — Z87442 Personal history of urinary calculi: Secondary | ICD-10-CM | POA: Diagnosis not present

## 2018-08-26 DIAGNOSIS — Z823 Family history of stroke: Secondary | ICD-10-CM

## 2018-08-26 DIAGNOSIS — R52 Pain, unspecified: Secondary | ICD-10-CM

## 2018-08-26 DIAGNOSIS — H9190 Unspecified hearing loss, unspecified ear: Secondary | ICD-10-CM | POA: Diagnosis present

## 2018-08-26 DIAGNOSIS — N3000 Acute cystitis without hematuria: Secondary | ICD-10-CM | POA: Diagnosis not present

## 2018-08-26 DIAGNOSIS — M810 Age-related osteoporosis without current pathological fracture: Secondary | ICD-10-CM | POA: Diagnosis present

## 2018-08-26 DIAGNOSIS — Z853 Personal history of malignant neoplasm of breast: Secondary | ICD-10-CM | POA: Diagnosis not present

## 2018-08-26 DIAGNOSIS — Z8349 Family history of other endocrine, nutritional and metabolic diseases: Secondary | ICD-10-CM

## 2018-08-26 DIAGNOSIS — K219 Gastro-esophageal reflux disease without esophagitis: Secondary | ICD-10-CM | POA: Diagnosis not present

## 2018-08-26 DIAGNOSIS — K56609 Unspecified intestinal obstruction, unspecified as to partial versus complete obstruction: Secondary | ICD-10-CM | POA: Diagnosis present

## 2018-08-26 DIAGNOSIS — R1032 Left lower quadrant pain: Secondary | ICD-10-CM | POA: Diagnosis not present

## 2018-08-26 DIAGNOSIS — Z801 Family history of malignant neoplasm of trachea, bronchus and lung: Secondary | ICD-10-CM

## 2018-08-26 DIAGNOSIS — Z8501 Personal history of malignant neoplasm of esophagus: Secondary | ICD-10-CM | POA: Diagnosis not present

## 2018-08-26 DIAGNOSIS — Z85828 Personal history of other malignant neoplasm of skin: Secondary | ICD-10-CM

## 2018-08-26 DIAGNOSIS — Z8249 Family history of ischemic heart disease and other diseases of the circulatory system: Secondary | ICD-10-CM

## 2018-08-26 DIAGNOSIS — Z803 Family history of malignant neoplasm of breast: Secondary | ICD-10-CM

## 2018-08-26 DIAGNOSIS — Z7982 Long term (current) use of aspirin: Secondary | ICD-10-CM

## 2018-08-26 DIAGNOSIS — K56600 Partial intestinal obstruction, unspecified as to cause: Principal | ICD-10-CM | POA: Diagnosis present

## 2018-08-26 DIAGNOSIS — K6389 Other specified diseases of intestine: Secondary | ICD-10-CM | POA: Diagnosis not present

## 2018-08-26 DIAGNOSIS — Z79890 Hormone replacement therapy (postmenopausal): Secondary | ICD-10-CM

## 2018-08-26 DIAGNOSIS — Z888 Allergy status to other drugs, medicaments and biological substances status: Secondary | ICD-10-CM

## 2018-08-26 DIAGNOSIS — Z91013 Allergy to seafood: Secondary | ICD-10-CM

## 2018-08-26 DIAGNOSIS — Z800 Family history of malignant neoplasm of digestive organs: Secondary | ICD-10-CM

## 2018-08-26 LAB — URINALYSIS, ROUTINE W REFLEX MICROSCOPIC
Bacteria, UA: NONE SEEN
Bilirubin Urine: NEGATIVE
Glucose, UA: NEGATIVE mg/dL
KETONES UR: NEGATIVE mg/dL
Nitrite: NEGATIVE
PROTEIN: NEGATIVE mg/dL
Specific Gravity, Urine: 1.038 — ABNORMAL HIGH (ref 1.005–1.030)
pH: 5 (ref 5.0–8.0)

## 2018-08-26 LAB — COMPREHENSIVE METABOLIC PANEL
ALBUMIN: 3.6 g/dL (ref 3.5–5.0)
ALT: 23 U/L (ref 0–44)
ANION GAP: 6 (ref 5–15)
AST: 29 U/L (ref 15–41)
Alkaline Phosphatase: 63 U/L (ref 38–126)
BUN: 15 mg/dL (ref 8–23)
CHLORIDE: 107 mmol/L (ref 98–111)
CO2: 23 mmol/L (ref 22–32)
Calcium: 9.6 mg/dL (ref 8.9–10.3)
Creatinine, Ser: 0.86 mg/dL (ref 0.44–1.00)
GFR calc non Af Amer: >60 mL/min (ref >60)
GLUCOSE: 123 mg/dL — AB (ref 70–99)
POTASSIUM: 3.9 mmol/L (ref 3.5–5.1)
SODIUM: 136 mmol/L (ref 135–145)
Total Bilirubin: 0.7 mg/dL (ref 0.3–1.2)
Total Protein: 6.6 g/dL (ref 6.5–8.1)

## 2018-08-26 LAB — CBC
HCT: 38.6 % (ref 36.0–46.0)
HEMOGLOBIN: 12.3 g/dL (ref 12.0–15.0)
MCH: 29.9 pg (ref 26.0–34.0)
MCHC: 31.9 g/dL (ref 30.0–36.0)
MCV: 93.7 fL (ref 80.0–100.0)
NRBC: 0 % (ref 0.0–0.2)
Platelets: 224 10*3/uL (ref 150–400)
RBC: 4.12 MIL/uL (ref 3.87–5.11)
RDW: 13.6 % (ref 11.5–15.5)
WBC: 7.2 10*3/uL (ref 4.0–10.5)

## 2018-08-26 LAB — LIPASE, BLOOD: LIPASE: 34 U/L (ref 11–51)

## 2018-08-26 MED ORDER — IOPAMIDOL (ISOVUE-300) INJECTION 61%
100.0000 mL | Freq: Once | INTRAVENOUS | Status: AC | PRN
  Administered 2018-08-26: 100 mL via INTRAVENOUS

## 2018-08-26 MED ORDER — SODIUM CHLORIDE 0.9% FLUSH: 3.0000 mL | Freq: Once | INTRAVENOUS | Status: DC

## 2018-08-26 NOTE — ED Triage Notes (Signed)
Pt diagnosed with UTI 1 week ago after experiencing left lower abdominal pain for about a week. She went back this morning re eval for continuing abdominal pain. Her doctor diagnosed her with shingles (blistering area to the left lower abdomen and left flank)  this morning. She also went for a CT scan and was told to come here for a bowel obstruction. No n/v/d or fevers. LBM around 1800 (has had 2-3 BM today).

## 2018-08-26 NOTE — ED Provider Notes (Signed)
[facility] EMERGENCY DEPARTMENT Provider Note   CSN: [id number] Arrival date & time: 08/26/18  1941    History   Chief Complaint Chief Complaint  Patient presents with  . Abdominal Pain    HPI Angelica Roberts is a 85 y.o. female.     HPI Patient presents with abdominal pain and bowel obstruction.  Over the last week had been on antibiotics for urinary tract infection.  Had had left-sided abdominal pain.  Was seen at PCP today and found to have shingles on left abdomen.  However still had pain.  Has had some diarrhea which is not unusual.  Urine reportedly had shown infection again.  However CT scan done and showed bowel obstruction.  Has had nausea and states abdomen has been more distended.  Still having bowel movements. Past Medical History:  Diagnosis Date  . Anemia   . Arthritis    osteoarthritis  . Bladder disorder June 2012   Sling  . Bone spur of toe    right big toe  . Cancer ([place]) 02/2004   Esophageal , Skin cancer - basal cell  . Carotid artery occlusion    Bruit  . Constipation   . 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 Gait abnormality   . GERD (gastroesophageal reflux disease)   . Glaucoma   . Hearing impaired   . History of hiatal hernia   . History of kidney stones   . Incontinence of bowel   . Kidney stone   . Osteoporosis   . Personal history of radiation therapy   . Postmenopausal HRT (hormone replacement therapy)   . Radiation 11/12/13-12/03/13   left breast 42.72 gray  . Wears partial dentures    partial - lower    Patient Active Problem List   Diagnosis Date Noted  . History of esophageal cancer 06/23/2015  . DCIS (ductal carcinoma in situ) of breast 10/06/2013  . Breast cancer of upper-outer quadrant of left female breast ([place]) 09/30/2013  . Occlusion and stenosis of carotid artery without mention of cerebral infarction 01/08/2013    Past Surgical History:  Procedure Laterality Date  . BREAST BIOPSY  Left 09/21/2013  . BREAST LUMPECTOMY Left 10/09/2013  . BREAST LUMPECTOMY WITH NEEDLE LOCALIZATION Left 10/09/2013   Procedure: BREAST LUMPECTOMY WITH NEEDLE LOCALIZATION;  Surgeon: Merrie Roof, MD;  Location: [facility];  Service: General;  Laterality: Left;  . CHOLECYSTECTOMY  1995   Gall Bladder  . COLONOSCOPY  2012   per patient  . dental     dental implants  . 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 EXTRACORPOREAL SHOCK WAVE LITHOTRIPSY Right 02/25/2017   Procedure: RIGHT EXTRACORPOREAL SHOCK WAVE LITHOTRIPSY (ESWL);  Surgeon: Kathie Rhodes, MD;  Location: [facility];  Service: Urology;  Laterality: Right;  . EYE SURGERY Bilateral 2017   cataracts  . FOOT SURGERY Right July 2013   Bone spur  . HERNIA REPAIR  1982   corrective surgery  . INCONTINENCE SURGERY  june 2012  . LITHOTRIPSY    . VAGINAL HYSTERECTOMY  1977     OB History   No obstetric history on file.      Home Medications    Prior to Admission medications   Medication Sig Start Date End Date Taking? Authorizing Provider  acetaminophen (TYLENOL) 500 MG tablet Take 1,000 mg by mouth 4 (four) times daily.    Yes [provider]  aspirin  EC 81 MG tablet Take 81 mg by mouth daily.   Yes [provider]  b complex vitamins tablet Take 1 tablet by mouth daily.   Yes [provider]  cephALEXin (KEFLEX) 500 MG capsule Take 500 mg by mouth 2 (two) times daily. 08/20/18 08/27/18 Yes [provider]  Cholecalciferol (VITAMIN D3) 125 MCG (5000 UT) CAPS Take 5,000 Units by mouth daily.   Yes [provider]  ciprofloxacin (CIPRO) 500 MG tablet Take 500 mg by mouth 2 (two) times daily. FOR 7 DAYS 08/26/18 09/01/18 Yes [provider]  denosumab (PROLIA) 60 MG/ML SOLN injection Inject 60 mg into the skin every 6 (six) months. Administer in upper arm, thigh, or abdomen   Yes [provider]  esomeprazole (NEXIUM) 40 MG capsule Take 40 mg by mouth daily  before breakfast.    Yes [provider]  famotidine-calcium carbonate-magnesium hydroxide (PEPCID COMPLETE) 10-800-165 MG chewable tablet Chew 1 tablet by mouth daily.   Yes [provider]  gabapentin (NEURONTIN) 100 MG capsule Take 100 mg by mouth 3 (three) times daily. 08/26/18  Yes [provider]  latanoprost (XALATAN) 0.005 % ophthalmic solution Place 1 drop into both eyes at bedtime.   Yes [provider]  loratadine (CLARITIN) 10 MG tablet Take 10 mg by mouth daily.   Yes [provider]  Multiple Minerals (CALCIUM-MAGNESIUM-ZINC) TABS Take 1 tablet by mouth daily.   Yes [provider]  Multiple Vitamins-Minerals (HAIR/SKIN/NAILS) CAPS Take 1 capsule by mouth daily.   Yes [provider]  rosuvastatin (CRESTOR) 10 MG tablet Take 10 mg by mouth daily.   Yes [provider]  traMADol (ULTRAM) 50 MG tablet Take 50 mg by mouth every 8 (eight) hours as needed (for pain).   Yes [provider]  valACYclovir (VALTREX) 1000 MG tablet Take 1,000 mg by mouth 3 (three) times daily. FOR 10 DAYS 08/26/18 09/04/18 Yes [provider]  ibuprofen (ADVIL,MOTRIN) 200 MG tablet Take 200 mg by mouth at bedtime.    [provider]    Family History Family History  Problem Relation Age of Onset  . Hernia Mother        Hiatal  . Diabetes Mother   . Heart attack Mother 7  . Stroke Mother 7  . Hypertension Father   . Stroke Father 80  . Diabetes Father   . Hyperlipidemia Father   . Stroke Sister   . Breast cancer Sister 39       TAH/BSO @ 75; currently 77  . Colon cancer Brother 66       lung cancer @ 46; currently 38  . Heart attack Maternal Grandmother   . Hernia Maternal Grandmother   . Stroke Paternal Grandmother 86  . Stroke Paternal Grandfather 7  . Cancer Brother        Esophageal in 34s; deceased at 96; non-smoker  . Cancer Maternal Grandfather        GI cancer in 20s; deceased 66s  .  Breast cancer Maternal Aunt        dx 49s; deceased late 73s  . Cancer Paternal Uncle        2 pat uncles with GI cancer in late 70s/80  . Cancer Cousin        2 pat cousins with GI cancer <50yo (sons of uncle with GI cancer)    Social History Social History   Tobacco Use  . Smoking status: Never Smoker  . Smokeless tobacco:  Never Used  Substance Use Topics  . Alcohol use: No    Alcohol/week: 0.0 standard drinks  . Drug use: No     Allergies   Shellfish allergy and Iodine   Review of Systems Review of Systems  Constitutional: Positive for appetite change.  HENT: Negative for congestion.   Respiratory: Negative for shortness of breath.   Cardiovascular: Negative for chest pain.  Gastrointestinal: Positive for abdominal pain and nausea.  Genitourinary: Negative for flank pain.  Musculoskeletal: Negative for gait problem.  Skin: Positive for rash.  Neurological: Positive for weakness.  Psychiatric/Behavioral: Negative for confusion.     Physical Exam Updated Vital Signs BP 131/62   Pulse 86   Temp 98 F (36.7 C) (Oral)   Resp 16   SpO2 95%   Physical Exam Vitals signs and nursing note reviewed.  HENT:     Head: Atraumatic.  Eyes:     Pupils: Pupils are equal, round, and reactive to light.  Cardiovascular:     Rate and Rhythm: Regular rhythm.  Pulmonary:     Breath sounds: Normal breath sounds.  Abdominal:     General: Bowel sounds are normal.     Hernia: No hernia is present.     Comments: Somewhat diffuse abdominal tenderness.  Blistering rash left lower abdomen.  No hernia palpated.  Mild distention.  Skin:    General: Skin is warm.     Comments: Zoster to left lower abdomen and left back/flank.  Neurological:     General: No focal deficit present.     Mental Status: She is alert.      ED Treatments / Results  Labs (all labs ordered are listed, but only abnormal results are displayed) Labs Reviewed  COMPREHENSIVE METABOLIC PANEL - Abnormal;  Notable for the following components:      Result Value   Glucose, Bld 123 (*)    All other components within normal limits  URINALYSIS, ROUTINE W REFLEX MICROSCOPIC - Abnormal; Notable for the following components:   Specific Gravity, Urine 1.038 (*)    Hgb urine dipstick SMALL (*)    Leukocytes,Ua MODERATE (*)    All other components within normal limits  LIPASE, BLOOD  CBC    EKG None  Radiology Ct Abdomen Pelvis W Contrast  Result Date: 08/26/2018 CLINICAL DATA:  85 year old female with LEFT abdominal and pelvic pain for 1 week. History of esophageal cancer, esophagogastrectomy and LEFT breast cancer. EXAM: CT ABDOMEN AND PELVIS WITH CONTRAST TECHNIQUE: Multidetector CT imaging of the abdomen and pelvis was performed using the standard protocol following bolus administration of intravenous contrast. CONTRAST:  158mL ISOVUE-300 IOPAMIDOL (ISOVUE-300) INJECTION 61% COMPARISON:  02/12/2017 CT FINDINGS: Lower chest: No acute abnormality. Esophagogastrectomy changes again noted. Hepatobiliary: The liver is unremarkable. No biliary dilatation. The patient is status post cholecystectomy. Pancreas: Unremarkable Spleen: Unremarkable Adrenals/Urinary Tract: Moderate RIGHT hydronephrosis has slightly decreased since 02/12/2017. The previously identified RIGHT UPJ calculus is no longer present. No obstructing urinary calculi identified. The LEFT kidney, adrenal glands and bladder are unremarkable. Stomach/Bowel: There are dilated proximal and mid small bowel loops noted with collapsed distal small bowel loops. No discrete transition point is identified. A loop of dilated small bowel enters an unchanged RIGHT paraumbilical hernia but no discrete transition point is identified at this hernia. There is gradual tapering of the small bowel distal to the hernia with mild circumferential wall thickening of a small bowel loop within the anterior LEFT abdomen. No evidence of pneumoperitoneum, bowel inflammatory  changes  or other definite bowel wall thickening. The appendix is normal. Vascular/Lymphatic: Aortic atherosclerosis. No enlarged abdominal or pelvic lymph nodes. Reproductive: Status post hysterectomy. No adnexal masses. Other: Pelvic floor laxity again noted.  No ascites or abscess. Musculoskeletal: No acute or suspicious bony abnormalities. IMPRESSION: 1. Dilated proximal and mid small bowel loops with gradual tapering and nondistended distal small bowel loops. Circumferential wall thickening of small bowel loop in the anterior LEFT abdomen which may represent enteritis/inflammation. Dilated small bowel appearance and pattern likely represents at least a partial small bowel obstruction. Loop of dilated small bowel enters and unchanged RIGHT paraumbilical hernia but no discrete transition point at this site. No evidence of pneumoperitoneum, ascites or abscess. 2. Moderate RIGHT hydronephrosis which has slightly decreased since 02/12/2017. Previously identified RIGHT UPJ calculus is no longer visualized. No obstructing urinary calculi. 3.  Aortic Atherosclerosis (ICD10-I70.0). Electronically Signed   By: Margarette Canada M.D.   On: 08/26/2018 13:59    Procedures Procedures (including critical care time)  Medications Ordered in ED Medications  sodium chloride flush (NS) 0.9 % injection 3 mL (has no administration in time range)     Initial Impression / Assessment and Plan / ED Course  I have reviewed the triage vital signs and the nursing notes.  Pertinent labs & imaging results that were available during my care of the patient were reviewed by me and considered in my medical decision making (see chart for details).        Patient presents with abdominal pain some nausea and abdominal distention.  CT scan done as an outpatient showed possible bowel obstruction.  Has a gradual transition point.  Still having bowel movements states her abdomen is more distended.  Pain but does have zoster also in the  left lower quadrant.  With increasing distention will admit to internal medicine.  Discussed with hospitalist  Final Clinical Impressions(s) / ED Diagnoses   Final diagnoses:  Partial intestinal obstruction, unspecified cause ([place])  Herpes zoster without complication    ED Discharge Orders    None       Davonna Belling, MD 08/26/18 2335

## 2018-08-27 ENCOUNTER — Encounter (HOSPITAL_COMMUNITY): Payer: Self-pay | Admitting: Internal Medicine

## 2018-08-27 DIAGNOSIS — Z79890 Hormone replacement therapy (postmenopausal): Secondary | ICD-10-CM | POA: Diagnosis not present

## 2018-08-27 DIAGNOSIS — K56600 Partial intestinal obstruction, unspecified as to cause: Secondary | ICD-10-CM | POA: Diagnosis not present

## 2018-08-27 DIAGNOSIS — N390 Urinary tract infection, site not specified: Secondary | ICD-10-CM | POA: Diagnosis not present

## 2018-08-27 DIAGNOSIS — K5669 Other partial intestinal obstruction: Secondary | ICD-10-CM | POA: Diagnosis not present

## 2018-08-27 DIAGNOSIS — B029 Zoster without complications: Secondary | ICD-10-CM

## 2018-08-27 DIAGNOSIS — K219 Gastro-esophageal reflux disease without esophagitis: Secondary | ICD-10-CM | POA: Diagnosis not present

## 2018-08-27 DIAGNOSIS — M1990 Unspecified osteoarthritis, unspecified site: Secondary | ICD-10-CM | POA: Diagnosis not present

## 2018-08-27 DIAGNOSIS — Z923 Personal history of irradiation: Secondary | ICD-10-CM | POA: Diagnosis not present

## 2018-08-27 DIAGNOSIS — Z7982 Long term (current) use of aspirin: Secondary | ICD-10-CM | POA: Diagnosis not present

## 2018-08-27 DIAGNOSIS — Z85828 Personal history of other malignant neoplasm of skin: Secondary | ICD-10-CM | POA: Diagnosis not present

## 2018-08-27 DIAGNOSIS — K56609 Unspecified intestinal obstruction, unspecified as to partial versus complete obstruction: Secondary | ICD-10-CM

## 2018-08-27 DIAGNOSIS — Z91013 Allergy to seafood: Secondary | ICD-10-CM | POA: Diagnosis not present

## 2018-08-27 DIAGNOSIS — Z803 Family history of malignant neoplasm of breast: Secondary | ICD-10-CM | POA: Diagnosis not present

## 2018-08-27 DIAGNOSIS — Z801 Family history of malignant neoplasm of trachea, bronchus and lung: Secondary | ICD-10-CM | POA: Diagnosis not present

## 2018-08-27 DIAGNOSIS — H409 Unspecified glaucoma: Secondary | ICD-10-CM | POA: Diagnosis present

## 2018-08-27 DIAGNOSIS — H9190 Unspecified hearing loss, unspecified ear: Secondary | ICD-10-CM | POA: Diagnosis present

## 2018-08-27 DIAGNOSIS — D0510 Intraductal carcinoma in situ of unspecified breast: Secondary | ICD-10-CM | POA: Diagnosis present

## 2018-08-27 DIAGNOSIS — Z833 Family history of diabetes mellitus: Secondary | ICD-10-CM | POA: Diagnosis not present

## 2018-08-27 DIAGNOSIS — Z800 Family history of malignant neoplasm of digestive organs: Secondary | ICD-10-CM | POA: Diagnosis not present

## 2018-08-27 DIAGNOSIS — Z8349 Family history of other endocrine, nutritional and metabolic diseases: Secondary | ICD-10-CM | POA: Diagnosis not present

## 2018-08-27 DIAGNOSIS — Z823 Family history of stroke: Secondary | ICD-10-CM | POA: Diagnosis not present

## 2018-08-27 DIAGNOSIS — M810 Age-related osteoporosis without current pathological fracture: Secondary | ICD-10-CM | POA: Diagnosis present

## 2018-08-27 DIAGNOSIS — Z8501 Personal history of malignant neoplasm of esophagus: Secondary | ICD-10-CM | POA: Diagnosis not present

## 2018-08-27 DIAGNOSIS — Z888 Allergy status to other drugs, medicaments and biological substances status: Secondary | ICD-10-CM | POA: Diagnosis not present

## 2018-08-27 DIAGNOSIS — Z8249 Family history of ischemic heart disease and other diseases of the circulatory system: Secondary | ICD-10-CM | POA: Diagnosis not present

## 2018-08-27 DIAGNOSIS — E785 Hyperlipidemia, unspecified: Secondary | ICD-10-CM | POA: Diagnosis present

## 2018-08-27 LAB — COMPREHENSIVE METABOLIC PANEL
ALT: 22 U/L (ref 0–44)
AST: 31 U/L (ref 15–41)
Albumin: 3.2 g/dL — ABNORMAL LOW (ref 3.5–5.0)
Alkaline Phosphatase: 60 U/L (ref 38–126)
Anion gap: 6 (ref 5–15)
BUN: 15 mg/dL (ref 8–23)
CO2: 22 mmol/L (ref 22–32)
Calcium: 8.9 mg/dL (ref 8.9–10.3)
Chloride: 109 mmol/L (ref 98–111)
Creatinine, Ser: 0.75 mg/dL (ref 0.44–1.00)
GFR calc non Af Amer: >60 mL/min (ref >60)
Glucose, Bld: 129 mg/dL — ABNORMAL HIGH (ref 70–99)
Potassium: 3.8 mmol/L (ref 3.5–5.1)
Sodium: 137 mmol/L (ref 135–145)
Total Bilirubin: 0.6 mg/dL (ref 0.3–1.2)
Total Protein: 6.1 g/dL — ABNORMAL LOW (ref 6.5–8.1)

## 2018-08-27 LAB — CBC WITH DIFFERENTIAL/PLATELET
ABS IMMATURE GRANULOCYTES: 0.01 10*3/uL (ref 0.00–0.07)
BASOS PCT: 1 %
Basophils Absolute: 0 10*3/uL (ref 0.0–0.1)
Eosinophils Absolute: 0.1 10*3/uL (ref 0.0–0.5)
Eosinophils Relative: 1 %
HCT: 34.8 % — ABNORMAL LOW (ref 36.0–46.0)
Hemoglobin: 11.2 g/dL — ABNORMAL LOW (ref 12.0–15.0)
Immature Granulocytes: 0 %
Lymphocytes Relative: 17 %
Lymphs Abs: 1.2 10*3/uL (ref 0.7–4.0)
MCH: 29.6 pg (ref 26.0–34.0)
MCHC: 32.2 g/dL (ref 30.0–36.0)
MCV: 91.8 fL (ref 80.0–100.0)
Monocytes Absolute: 0.6 10*3/uL (ref 0.1–1.0)
Monocytes Relative: 9 %
NRBC: 0 % (ref 0.0–0.2)
Neutro Abs: 4.8 10*3/uL (ref 1.7–7.7)
Neutrophils Relative %: 72 %
Platelets: 205 10*3/uL (ref 150–400)
RBC: 3.79 MIL/uL — ABNORMAL LOW (ref 3.87–5.11)
RDW: 13.6 % (ref 11.5–15.5)
WBC: 6.7 10*3/uL (ref 4.0–10.5)

## 2018-08-27 MED ORDER — ONDANSETRON HCL 4 MG PO TABS: 4.0000 mg | ORAL_TABLET | Freq: Four times a day (QID) | ORAL | Status: DC | PRN

## 2018-08-27 MED ORDER — LATANOPROST 0.005 % OP SOLN
1.0000 [drp] | Freq: Every day | OPHTHALMIC | Status: DC
  Administered 2018-08-27: 1 [drp] via OPHTHALMIC
  Filled 2018-08-27: qty 2.5

## 2018-08-27 MED ORDER — ACETAMINOPHEN 650 MG RE SUPP
650.0000 mg | Freq: Once | RECTAL | Status: AC
  Administered 2018-08-27: 650 mg via RECTAL
  Filled 2018-08-27: qty 1

## 2018-08-27 MED ORDER — ONDANSETRON HCL 4 MG/2ML IJ SOLN: 4.0000 mg | Freq: Four times a day (QID) | INTRAMUSCULAR | Status: DC | PRN

## 2018-08-27 MED ORDER — DOCUSATE SODIUM 100 MG PO CAPS
100.0000 mg | ORAL_CAPSULE | Freq: Two times a day (BID) | ORAL | Status: DC
  Administered 2018-08-27 – 2018-08-28 (×3): 100 mg via ORAL
  Filled 2018-08-27 (×3): qty 1

## 2018-08-27 MED ORDER — POLYETHYLENE GLYCOL 3350 17 G PO PACK
17.0000 g | PACK | Freq: Every day | ORAL | Status: DC
  Administered 2018-08-27 – 2018-08-28 (×2): 17 g via ORAL
  Filled 2018-08-27 (×2): qty 1

## 2018-08-27 MED ORDER — ACETAMINOPHEN 650 MG RE SUPP: 650.0000 mg | Freq: Four times a day (QID) | RECTAL | Status: DC | PRN

## 2018-08-27 MED ORDER — BISACODYL 10 MG RE SUPP: 10.0000 mg | Freq: Every day | RECTAL | Status: DC | PRN

## 2018-08-27 MED ORDER — DEXTROSE 5 % IV SOLN
500.0000 mg | Freq: Two times a day (BID) | INTRAVENOUS | Status: DC
  Administered 2018-08-27: 500 mg via INTRAVENOUS
  Filled 2018-08-27 (×2): qty 10

## 2018-08-27 MED ORDER — ACYCLOVIR 800 MG PO TABS
800.0000 mg | ORAL_TABLET | Freq: Every day | ORAL | Status: DC
  Filled 2018-08-27 (×2): qty 1

## 2018-08-27 MED ORDER — ACYCLOVIR 400 MG PO TABS
800.0000 mg | ORAL_TABLET | Freq: Every day | ORAL | Status: DC
  Administered 2018-08-27 – 2018-08-28 (×3): 800 mg via ORAL
  Filled 2018-08-27 (×5): qty 2
  Filled 2018-08-27: qty 1
  Filled 2018-08-27: qty 2

## 2018-08-27 MED ORDER — DEXTROSE-NACL 5-0.9 % IV SOLN
INTRAVENOUS | Status: AC
  Administered 2018-08-27 (×2): via INTRAVENOUS

## 2018-08-27 MED ORDER — ALUM & MAG HYDROXIDE-SIMETH 200-200-20 MG/5ML PO SUSP
30.0000 mL | ORAL | Status: DC | PRN
  Administered 2018-08-27: 30 mL via ORAL
  Filled 2018-08-27: qty 30

## 2018-08-27 MED ORDER — ACETAMINOPHEN 325 MG PO TABS
650.0000 mg | ORAL_TABLET | Freq: Four times a day (QID) | ORAL | Status: DC | PRN
  Administered 2018-08-27 (×3): 650 mg via ORAL
  Filled 2018-08-27 (×3): qty 2

## 2018-08-27 MED ORDER — MORPHINE SULFATE (PF) 2 MG/ML IV SOLN
1.0000 mg | INTRAVENOUS | Status: DC | PRN
  Administered 2018-08-28: 1 mg via INTRAVENOUS
  Filled 2018-08-27: qty 1

## 2018-08-27 NOTE — ED Notes (Signed)
Pt has dinner tray at bedside. 

## 2018-08-27 NOTE — Consult Note (Signed)
[facility] Surgery Consult Note  Angelica Roberts 1933/10/09  [id number].    Requesting MD: Hal Hope Chief Complaint/Reason for Consult: PSBO  HPI:  Patient is a 85 year old female with 1 week history of abdominal pain. She initially went an urgent care and was treated for UTI. She returned to urgent care and was still felt to have a UTI but the provider also noted a vesicular looking rash and started her on acyclovir for shingles. Patient reported that abdominal pain had worsened and did have some distention as well so CT abdomen obtained which showed PSBO. After drinking contrast patient reports she had a BM. Patient reports she did have some abdominal pain overnight too but felt that this was more from the shingles. Currently patient has pain on the left side that is sharp/burning. She denies nausea or vomiting. She reports a history of multiple past abdominal surgeries. She reports that she frequently alternates between constipation and diarrhea. Patient reports last BM was yesterday and was soft. Denies blood in stool. Denies fever, chills, chest pain, SOB, cough. Patient is not on any blood thinning medications. Allergic to shellfish and iodine.   ROS: Review of Systems  Constitutional: Negative for chills and fever.  Respiratory: Negative for shortness of breath and wheezing.   Cardiovascular: Negative for chest pain and palpitations.  Gastrointestinal: Positive for abdominal pain, constipation and diarrhea. Negative for blood in stool, nausea and vomiting.  Genitourinary: Positive for dysuria and flank pain.  All other systems reviewed and are negative.   Family History  Problem Relation Age of Onset  . Hernia Mother        Hiatal  . Diabetes Mother   . Heart attack Mother 27  . Stroke Mother 33  . Hypertension Father   . Stroke Father 48  . Diabetes Father   . Hyperlipidemia Father   . Stroke Sister   . Breast cancer Sister 37       TAH/BSO @ 34; currently 58  .  Colon cancer Brother 49       lung cancer @ 42; currently 6  . Heart attack Maternal Grandmother   . Hernia Maternal Grandmother   . Stroke Paternal Grandmother 27  . Stroke Paternal Grandfather 72  . Cancer Brother        Esophageal in 15s; deceased at 45; non-smoker  . Cancer Maternal Grandfather        GI cancer in 3s; deceased 13s  . Breast cancer Maternal Aunt        dx 57s; deceased late 87s  . Cancer Paternal Uncle        2 pat uncles with GI cancer in late 70s/80  . Cancer Cousin        2 pat cousins with GI cancer <50yo (sons of uncle with GI cancer)    Past Medical History:  Diagnosis Date  . Anemia   . Arthritis    osteoarthritis  . Bladder disorder June 2012   Sling  . Bone spur of toe    right big toe  . Cancer ([place]) 02/2004   Esophageal , Skin cancer - basal cell  . Carotid artery occlusion    Bruit  . Constipation   . 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 Gait abnormality   . GERD (gastroesophageal reflux disease)   . Glaucoma   . Hearing impaired   . History of hiatal hernia   . History of kidney stones   .  Incontinence of bowel   . Kidney stone   . Osteoporosis   . Personal history of radiation therapy   . Postmenopausal HRT (hormone replacement therapy)   . Radiation 11/12/13-12/03/13   left breast 42.72 gray  . Wears partial dentures    partial - lower    Past Surgical History:  Procedure Laterality Date  . BREAST BIOPSY Left 09/21/2013  . BREAST LUMPECTOMY Left 10/09/2013  . BREAST LUMPECTOMY WITH NEEDLE LOCALIZATION Left 10/09/2013   Procedure: BREAST LUMPECTOMY WITH NEEDLE LOCALIZATION;  Surgeon: Merrie Roof, MD;  Location: [employer] [facility];  Service: General;  Laterality: Left;  . CHOLECYSTECTOMY  1995   Gall Bladder  . COLONOSCOPY  2012   per patient  . dental     dental implants  . 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 EXTRACORPOREAL SHOCK WAVE LITHOTRIPSY Right 02/25/2017   Procedure: RIGHT  EXTRACORPOREAL SHOCK WAVE LITHOTRIPSY (ESWL);  Surgeon: Kathie Rhodes, MD;  Location: [facility];  Service: Urology;  Laterality: Right;  . EYE SURGERY Bilateral 2017   cataracts  . FOOT SURGERY Right July 2013   Bone spur  . HERNIA REPAIR  1982   corrective surgery  . INCONTINENCE SURGERY  june 2012  . LITHOTRIPSY    . VAGINAL HYSTERECTOMY  1977    Social History:  reports that she has never smoked. She has never used smokeless tobacco. She reports that she does not drink alcohol or use drugs.  Allergies:  Allergies  Allergen Reactions  . Shellfish Allergy Diarrhea, Nausea Only, Swelling and Rash  . Iodine Rash    Topical iodine    (Not in a hospital admission)   Blood pressure (!) 112/58, pulse 77, temperature 98.2 F (36.8 C), temperature source Oral, resp. rate 15, height 5\' 2"  (1.575 m), weight 59.9 kg, SpO2 95 %. Physical Exam: Physical Exam Constitutional:      General: She is not in acute distress.    Appearance: She is well-developed and normal weight. She is not toxic-appearing.  HENT:     Head: Normocephalic and atraumatic.     Right Ear: External ear normal.     Left Ear: External ear normal.     Nose: Nose normal.     Mouth/Throat:     Lips: Pink.     Mouth: Mucous membranes are moist.  Eyes:     General: Lids are normal. No scleral icterus.    Extraocular Movements: Extraocular movements intact.     Conjunctiva/sclera: Conjunctivae normal.  Neck:     Musculoskeletal: Normal range of motion and neck supple.  Cardiovascular:     Rate and Rhythm: Normal rate and regular rhythm.     Pulses:          Radial pulses are 2+ on the right side and 2+ on the left side.       Dorsalis pedis pulses are 2+ on the right side and 2+ on the left side.  Pulmonary:     Effort: Pulmonary effort is normal.     Breath sounds: Normal breath sounds.  Abdominal:     General: Bowel sounds are normal. There is no distension.     Palpations: Abdomen is soft.     Tenderness:  There is abdominal tenderness (mild) in the left upper quadrant and left lower quadrant. There is no guarding or rebound.     Comments: Midline scar with reducible hernia  Musculoskeletal:     Comments: ROM grossly intact in  bilateral upper and lower extremities.  Skin:    General: Skin is warm.     Findings: Rash present. Rash is vesicular.     Comments: Dermatomal distribution of vesicular rash along L flank  Neurological:     Mental Status: She is alert and oriented to person, place, and time.  Psychiatric:        Attention and Perception: Attention and perception normal.        Mood and Affect: Mood and affect normal.        Speech: Speech normal.        Behavior: Behavior is cooperative.     Results for orders placed or performed during the hospital encounter of 08/26/18 (from the past 48 hour(s))  Urinalysis, Routine w reflex microscopic     Status: Abnormal   Collection Time: 08/26/18  8:13 PM  Result Value Ref Range   Color, Urine YELLOW YELLOW   APPearance CLEAR CLEAR   Specific Gravity, Urine 1.038 (H) 1.005 - 1.030   pH 5.0 5.0 - 8.0   Glucose, UA NEGATIVE NEGATIVE mg/dL   Hgb urine dipstick SMALL (A) NEGATIVE   Bilirubin Urine NEGATIVE NEGATIVE   Ketones, ur NEGATIVE NEGATIVE mg/dL   Protein, ur NEGATIVE NEGATIVE mg/dL   Nitrite NEGATIVE NEGATIVE   Leukocytes,Ua MODERATE (A) NEGATIVE   RBC / HPF 0-5 0 - 5 RBC/hpf   WBC, UA 11-20 0 - 5 WBC/hpf   Bacteria, UA NONE SEEN NONE SEEN   Squamous Epithelial / LPF 0-5 0 - 5    Comment: Performed at [facility], [place] [address] [place], [place] [address]  Lipase, blood     Status: None   Collection Time: 08/26/18  8:43 PM  Result Value Ref Range   Lipase 34 11 - 51 U/L    Comment: Performed at [facility] [address], [place], [place] [address]  Comprehensive metabolic panel     Status: Abnormal   Collection Time: 08/26/18  8:43 PM  Result Value Ref Range   Sodium 136 135 - 145 mmol/L   Potassium 3.9  3.5 - 5.1 mmol/L   Chloride 107 98 - 111 mmol/L   CO2 23 22 - 32 mmol/L   Glucose, Bld 123 (H) 70 - 99 mg/dL   BUN 15 8 - 23 mg/dL   Creatinine, Ser 0.86 0.44 - 1.00 mg/dL   Calcium 9.6 8.9 - 10.3 mg/dL   Total Protein 6.6 6.5 - 8.1 g/dL   Albumin 3.6 3.5 - 5.0 g/dL   AST 29 15 - 41 U/L   ALT 23 0 - 44 U/L   Alkaline Phosphatase 63 38 - 126 U/L   Total Bilirubin 0.7 0.3 - 1.2 mg/dL   GFR calc non Af Amer >60 >60 mL/min   GFR calc Af Amer >60 >60 mL/min   Anion gap 6 5 - 15    Comment: Performed at [facility] [address] [place], [place] [address]  CBC     Status: None   Collection Time: 08/26/18  8:43 PM  Result Value Ref Range   WBC 7.2 4.0 - 10.5 K/uL   RBC 4.12 3.87 - 5.11 MIL/uL   Hemoglobin 12.3 12.0 - 15.0 g/dL   HCT 38.6 36.0 - 46.0 %   MCV 93.7 80.0 - 100.0 fL   MCH 29.9 26.0 - 34.0 pg   MCHC 31.9 30.0 - 36.0 g/dL   RDW 13.6 11.5 - 15.5 %  Platelets 224 150 - 400 K/uL   nRBC 0.0 0.0 - 0.2 %    Comment: Performed at [facility], [place] [address], [place], [place] [address]  Comprehensive metabolic panel     Status: Abnormal   Collection Time: 08/27/18  3:26 AM  Result Value Ref Range   Sodium 137 135 - 145 mmol/L   Potassium 3.8 3.5 - 5.1 mmol/L   Chloride 109 98 - 111 mmol/L   CO2 22 22 - 32 mmol/L   Glucose, Bld 129 (H) 70 - 99 mg/dL   BUN 15 8 - 23 mg/dL   Creatinine, Ser 0.75 0.44 - 1.00 mg/dL   Calcium 8.9 8.9 - 10.3 mg/dL   Total Protein 6.1 (L) 6.5 - 8.1 g/dL   Albumin 3.2 (L) 3.5 - 5.0 g/dL   AST 31 15 - 41 U/L   ALT 22 0 - 44 U/L   Alkaline Phosphatase 60 38 - 126 U/L   Total Bilirubin 0.6 0.3 - 1.2 mg/dL   GFR calc non Af Amer >60 >60 mL/min   GFR calc Af Amer >60 >60 mL/min   Anion gap 6 5 - 15    Comment: Performed at [facility] [address], [place], [place] [address]  CBC WITH DIFFERENTIAL     Status: Abnormal   Collection Time: 08/27/18  3:26 AM  Result Value Ref Range   WBC 6.7 4.0 - 10.5 K/uL   RBC 3.79 (L)  3.87 - 5.11 MIL/uL   Hemoglobin 11.2 (L) 12.0 - 15.0 g/dL   HCT 34.8 (L) 36.0 - 46.0 %   MCV 91.8 80.0 - 100.0 fL   MCH 29.6 26.0 - 34.0 pg   MCHC 32.2 30.0 - 36.0 g/dL   RDW 13.6 11.5 - 15.5 %   Platelets 205 150 - 400 K/uL   nRBC 0.0 0.0 - 0.2 %   Neutrophils Relative % 72 %   Neutro Abs 4.8 1.7 - 7.7 K/uL   Lymphocytes Relative 17 %   Lymphs Abs 1.2 0.7 - 4.0 K/uL   Monocytes Relative 9 %   Monocytes Absolute 0.6 0.1 - 1.0 K/uL   Eosinophils Relative 1 %   Eosinophils Absolute 0.1 0.0 - 0.5 K/uL   Basophils Relative 1 %   Basophils Absolute 0.0 0.0 - 0.1 K/uL   Immature Granulocytes 0 %   Abs Immature Granulocytes 0.01 0.00 - 0.07 K/uL    Comment: Performed at [facility], [address] [place], [place] [address]   Ct Abdomen Pelvis W Contrast  Result Date: 08/26/2018 CLINICAL DATA:  85 year old female with LEFT abdominal and pelvic pain for 1 week. History of esophageal cancer, esophagogastrectomy and LEFT breast cancer. EXAM: CT ABDOMEN AND PELVIS WITH CONTRAST TECHNIQUE: Multidetector CT imaging of the abdomen and pelvis was performed using the standard protocol following bolus administration of intravenous contrast. CONTRAST:  141mL ISOVUE-300 IOPAMIDOL (ISOVUE-300) INJECTION 61% COMPARISON:  02/12/2017 CT FINDINGS: Lower chest: No acute abnormality. Esophagogastrectomy changes again noted. Hepatobiliary: The liver is unremarkable. No biliary dilatation. The patient is status post cholecystectomy. Pancreas: Unremarkable Spleen: Unremarkable Adrenals/Urinary Tract: Moderate RIGHT hydronephrosis has slightly decreased since 02/12/2017. The previously identified RIGHT UPJ calculus is no longer present. No obstructing urinary calculi identified. The LEFT kidney, adrenal glands and bladder are unremarkable. Stomach/Bowel: There are dilated proximal and mid small bowel loops noted with collapsed distal small bowel loops. No discrete transition point is identified. A loop of dilated  small bowel  enters an unchanged RIGHT paraumbilical hernia but no discrete transition point is identified at this hernia. There is gradual tapering of the small bowel distal to the hernia with mild circumferential wall thickening of a small bowel loop within the anterior LEFT abdomen. No evidence of pneumoperitoneum, bowel inflammatory changes or other definite bowel wall thickening. The appendix is normal. Vascular/Lymphatic: Aortic atherosclerosis. No enlarged abdominal or pelvic lymph nodes. Reproductive: Status post hysterectomy. No adnexal masses. Other: Pelvic floor laxity again noted.  No ascites or abscess. Musculoskeletal: No acute or suspicious bony abnormalities. IMPRESSION: 1. Dilated proximal and mid small bowel loops with gradual tapering and nondistended distal small bowel loops. Circumferential wall thickening of small bowel loop in the anterior LEFT abdomen which may represent enteritis/inflammation. Dilated small bowel appearance and pattern likely represents at least a partial small bowel obstruction. Loop of dilated small bowel enters and unchanged RIGHT paraumbilical hernia but no discrete transition point at this site. No evidence of pneumoperitoneum, ascites or abscess. 2. Moderate RIGHT hydronephrosis which has slightly decreased since 02/12/2017. Previously identified RIGHT UPJ calculus is no longer visualized. No obstructing urinary calculi. 3.  Aortic Atherosclerosis (ICD10-I70.0). Electronically Signed   By: Margarette Canada M.D.   On: 08/26/2018 13:59      Assessment/Plan GERD Hx of esophageal cancer - s/p partial esophagectomy in 2005 Hx of breast cancer  Shingles - acyclovir per primary service PSBO - patient reports having bowel function yesterday and abdominal exam benign today - suggest CLD and bowel regimen for now - film in the AM - mobilize as tolerated - would not recommend NGT at this time as patient is not nauseated or distended  Brigid Re, [facility] Surgery 08/27/2018, 8:22 AM Pager: [phone] Consults: [phone]

## 2018-08-27 NOTE — ED Notes (Addendum)
ED TO INPATIENT HANDOFF REPORT  ED Nurse Name and Phone #:  [phone]  S Name/Age/Gender Angelica Roberts 85 y.o. female Room/Bed: 024C/024C  Code Status   Code Status: Full Code  Home/SNF/Other Home Patient oriented to: self, place, time and situation Is this baseline? Yes   Triage Complete: Triage complete  Chief Complaint Bowl Obstruction   Triage Note Pt diagnosed with UTI 1 week ago after experiencing left lower abdominal pain for about a week. She went back this morning re eval for continuing abdominal pain. Her doctor diagnosed her with shingles (blistering area to the left lower abdomen and left flank)  this morning. She also went for a CT scan and was told to come here for a bowel obstruction. No n/v/d or fevers. LBM around 1800 (has had 2-3 BM today).   Allergies Allergies  Allergen Reactions  . Shellfish Allergy Diarrhea, Nausea Only, Swelling and Rash  . Iodine Rash    Topical iodine    Level of Care/Admitting Diagnosis ED Disposition    ED Disposition Condition Comment   Admit  Hospital Area: [facility] [[id number]]  Level of Care: Med-Surg [16]  Diagnosis: SBO (small bowel obstruction) [facility]) [[id number]]  Admitting Physician: Rise Patience [phone]  Attending Physician: Rise Patience [phone]  Estimated length of stay: past midnight tomorrow  Certification:: I certify this patient will need inpatient services for at least 2 midnights  PT Class (Do Not Modify): Inpatient [101]  PT Acc Code (Do Not Modify): Private [1]       B Medical/Surgery History Past Medical History:  Diagnosis Date  . Anemia   . Arthritis    osteoarthritis  . Bladder disorder June 2012   Sling  . Bone spur of toe    right big toe  . Cancer ([place]) 02/2004   Esophageal , Skin cancer - basal cell  . Carotid artery occlusion    Bruit  . Constipation   . 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 Gait abnormality   . GERD (gastroesophageal  reflux disease)   . Glaucoma   . Hearing impaired   . History of hiatal hernia   . History of kidney stones   . Incontinence of bowel   . Kidney stone   . Osteoporosis   . Personal history of radiation therapy   . Postmenopausal HRT (hormone replacement therapy)   . Radiation 11/12/13-12/03/13   left breast 42.72 gray  . Wears partial dentures    partial - lower   Past Surgical History:  Procedure Laterality Date  . BREAST BIOPSY Left 09/21/2013  . BREAST LUMPECTOMY Left 10/09/2013  . BREAST LUMPECTOMY WITH NEEDLE LOCALIZATION Left 10/09/2013   Procedure: BREAST LUMPECTOMY WITH NEEDLE LOCALIZATION;  Surgeon: Merrie Roof, MD;  Location: [facility];  Service: General;  Laterality: Left;  . CHOLECYSTECTOMY  1995   Gall Bladder  . COLONOSCOPY  2012   per patient  . dental     dental implants  . 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 EXTRACORPOREAL SHOCK WAVE LITHOTRIPSY Right 02/25/2017   Procedure: RIGHT EXTRACORPOREAL SHOCK WAVE LITHOTRIPSY (ESWL);  Surgeon: Kathie Rhodes, MD;  Location: [facility];  Service: Urology;  Laterality: Right;  . EYE SURGERY Bilateral 2017   cataracts  . FOOT SURGERY Right July 2013   Bone spur  . HERNIA REPAIR  1982   corrective surgery  . INCONTINENCE SURGERY  june 2012  .  LITHOTRIPSY    . VAGINAL HYSTERECTOMY  1977     A IV Location/Drains/Wounds Patient Lines/Drains/Airways Status   Active Line/Drains/Airways    Name:   Placement date:   Placement time:   Site:   Days:   Peripheral IV 08/26/18 Left Forearm   08/26/18    2320    Forearm   1   Airway   -    -        Incision (Closed) 10/09/13 Breast Left   10/09/13    1509     1783          Intake/Output Last 24 hours No intake or output data in the 24 hours ending 08/27/18 1143  Labs/Imaging Results for orders placed or performed during the hospital encounter of 08/26/18 (from the past 48 hour(s))  Urinalysis, Routine w reflex microscopic     Status: Abnormal    Collection Time: 08/26/18  8:13 PM  Result Value Ref Range   Color, Urine YELLOW YELLOW   APPearance CLEAR CLEAR   Specific Gravity, Urine 1.038 (H) 1.005 - 1.030   pH 5.0 5.0 - 8.0   Glucose, UA NEGATIVE NEGATIVE mg/dL   Hgb urine dipstick SMALL (A) NEGATIVE   Bilirubin Urine NEGATIVE NEGATIVE   Ketones, ur NEGATIVE NEGATIVE mg/dL   Protein, ur NEGATIVE NEGATIVE mg/dL   Nitrite NEGATIVE NEGATIVE   Leukocytes,Ua MODERATE (A) NEGATIVE   RBC / HPF 0-5 0 - 5 RBC/hpf   WBC, UA 11-20 0 - 5 WBC/hpf   Bacteria, UA NONE SEEN NONE SEEN   Squamous Epithelial / LPF 0-5 0 - 5    Comment: Performed at [facility], [place] [address] [place], [place] [address]  Lipase, blood     Status: None   Collection Time: 08/26/18  8:43 PM  Result Value Ref Range   Lipase 34 11 - 51 U/L    Comment: Performed at [facility] [address], [place], [place] [address]  Comprehensive metabolic panel     Status: Abnormal   Collection Time: 08/26/18  8:43 PM  Result Value Ref Range   Sodium 136 135 - 145 mmol/L   Potassium 3.9 3.5 - 5.1 mmol/L   Chloride 107 98 - 111 mmol/L   CO2 23 22 - 32 mmol/L   Glucose, Bld 123 (H) 70 - 99 mg/dL   BUN 15 8 - 23 mg/dL   Creatinine, Ser 0.86 0.44 - 1.00 mg/dL   Calcium 9.6 8.9 - 10.3 mg/dL   Total Protein 6.6 6.5 - 8.1 g/dL   Albumin 3.6 3.5 - 5.0 g/dL   AST 29 15 - 41 U/L   ALT 23 0 - 44 U/L   Alkaline Phosphatase 63 38 - 126 U/L   Total Bilirubin 0.7 0.3 - 1.2 mg/dL   GFR calc non Af Amer >60 >60 mL/min   GFR calc Af Amer >60 >60 mL/min   Anion gap 6 5 - 15    Comment: Performed at [facility] [address] [place] [address]  CBC     Status: None   Collection Time: 08/26/18  8:43 PM  Result Value Ref Range   WBC 7.2 4.0 - 10.5 K/uL   RBC 4.12 3.87 - 5.11 MIL/uL   Hemoglobin 12.3 12.0 - 15.0 g/dL   HCT 38.6 36.0 - 46.0 %   MCV 93.7 80.0 - 100.0 fL   MCH 29.9 26.0 - 34.0 pg   MCHC 31.9 30.0 -  36.0 g/dL   RDW 13.6 11.5 - 15.5 %    Platelets 224 150 - 400 K/uL   nRBC 0.0 0.0 - 0.2 %    Comment: Performed at [facility], [place] [address] [place], [place] [address]  Comprehensive metabolic panel     Status: Abnormal   Collection Time: 08/27/18  3:26 AM  Result Value Ref Range   Sodium 137 135 - 145 mmol/L   Potassium 3.8 3.5 - 5.1 mmol/L   Chloride 109 98 - 111 mmol/L   CO2 22 22 - 32 mmol/L   Glucose, Bld 129 (H) 70 - 99 mg/dL   BUN 15 8 - 23 mg/dL   Creatinine, Ser 0.75 0.44 - 1.00 mg/dL   Calcium 8.9 8.9 - 10.3 mg/dL   Total Protein 6.1 (L) 6.5 - 8.1 g/dL   Albumin 3.2 (L) 3.5 - 5.0 g/dL   AST 31 15 - 41 U/L   ALT 22 0 - 44 U/L   Alkaline Phosphatase 60 38 - 126 U/L   Total Bilirubin 0.6 0.3 - 1.2 mg/dL   GFR calc non Af Amer >60 >60 mL/min   GFR calc Af Amer >60 >60 mL/min   Anion gap 6 5 - 15    Comment: Performed at [facility] [address], [place], [place] [address]  CBC WITH DIFFERENTIAL     Status: Abnormal   Collection Time: 08/27/18  3:26 AM  Result Value Ref Range   WBC 6.7 4.0 - 10.5 K/uL   RBC 3.79 (L) 3.87 - 5.11 MIL/uL   Hemoglobin 11.2 (L) 12.0 - 15.0 g/dL   HCT 34.8 (L) 36.0 - 46.0 %   MCV 91.8 80.0 - 100.0 fL   MCH 29.6 26.0 - 34.0 pg   MCHC 32.2 30.0 - 36.0 g/dL   RDW 13.6 11.5 - 15.5 %   Platelets 205 150 - 400 K/uL   nRBC 0.0 0.0 - 0.2 %   Neutrophils Relative % 72 %   Neutro Abs 4.8 1.7 - 7.7 K/uL   Lymphocytes Relative 17 %   Lymphs Abs 1.2 0.7 - 4.0 K/uL   Monocytes Relative 9 %   Monocytes Absolute 0.6 0.1 - 1.0 K/uL   Eosinophils Relative 1 %   Eosinophils Absolute 0.1 0.0 - 0.5 K/uL   Basophils Relative 1 %   Basophils Absolute 0.0 0.0 - 0.1 K/uL   Immature Granulocytes 0 %   Abs Immature Granulocytes 0.01 0.00 - 0.07 K/uL    Comment: Performed at [facility], [address] [place], [place] [address]   Ct Abdomen Pelvis W Contrast  Result Date: 08/26/2018 CLINICAL DATA:  85 year old female with LEFT abdominal and pelvic pain for 1 week.  History of esophageal cancer, esophagogastrectomy and LEFT breast cancer. EXAM: CT ABDOMEN AND PELVIS WITH CONTRAST TECHNIQUE: Multidetector CT imaging of the abdomen and pelvis was performed using the standard protocol following bolus administration of intravenous contrast. CONTRAST:  154mL ISOVUE-300 IOPAMIDOL (ISOVUE-300) INJECTION 61% COMPARISON:  02/12/2017 CT FINDINGS: Lower chest: No acute abnormality. Esophagogastrectomy changes again noted. Hepatobiliary: The liver is unremarkable. No biliary dilatation. The patient is status post cholecystectomy. Pancreas: Unremarkable Spleen: Unremarkable Adrenals/Urinary Tract: Moderate RIGHT hydronephrosis has slightly decreased since 02/12/2017. The previously identified RIGHT UPJ calculus is no longer present. No obstructing urinary calculi identified. The LEFT kidney, adrenal glands and bladder are unremarkable. Stomach/Bowel: There are dilated proximal and mid small bowel loops noted with collapsed distal small bowel loops.  No discrete transition point is identified. A loop of dilated small bowel enters an unchanged RIGHT paraumbilical hernia but no discrete transition point is identified at this hernia. There is gradual tapering of the small bowel distal to the hernia with mild circumferential wall thickening of a small bowel loop within the anterior LEFT abdomen. No evidence of pneumoperitoneum, bowel inflammatory changes or other definite bowel wall thickening. The appendix is normal. Vascular/Lymphatic: Aortic atherosclerosis. No enlarged abdominal or pelvic lymph nodes. Reproductive: Status post hysterectomy. No adnexal masses. Other: Pelvic floor laxity again noted.  No ascites or abscess. Musculoskeletal: No acute or suspicious bony abnormalities. IMPRESSION: 1. Dilated proximal and mid small bowel loops with gradual tapering and nondistended distal small bowel loops. Circumferential wall thickening of small bowel loop in the anterior LEFT abdomen which may  represent enteritis/inflammation. Dilated small bowel appearance and pattern likely represents at least a partial small bowel obstruction. Loop of dilated small bowel enters and unchanged RIGHT paraumbilical hernia but no discrete transition point at this site. No evidence of pneumoperitoneum, ascites or abscess. 2. Moderate RIGHT hydronephrosis which has slightly decreased since 02/12/2017. Previously identified RIGHT UPJ calculus is no longer visualized. No obstructing urinary calculi. 3.  Aortic Atherosclerosis (ICD10-I70.0). Electronically Signed   By: Margarette Canada M.D.   On: 08/26/2018 13:59    Pending Labs Unresulted Labs (From admission, onward)    Start     Ordered   08/28/18 7564  Basic metabolic panel  Tomorrow morning,   R     08/27/18 0821          Vitals/Pain Today's Vitals   08/27/18 1000 08/27/18 1015 08/27/18 1050 08/27/18 1142  BP: (!) 98/49 112/81    Pulse: 74 86    Resp:      Temp:      TempSrc:      SpO2: 97% 97%    Weight:      Height:      PainSc:   6  3     Isolation Precautions Airborne and Contact precautions  Medications Medications  sodium chloride flush (NS) 0.9 % injection 3 mL (3 mLs Intravenous Not Given 08/27/18 0315)  latanoprost (XALATAN) 0.005 % ophthalmic solution 1 drop (has no administration in time range)  acetaminophen (TYLENOL) tablet 650 mg (650 mg Oral Given 08/27/18 1056)    Or  acetaminophen (TYLENOL) suppository 650 mg ( Rectal See Alternative 08/27/18 1056)  ondansetron (ZOFRAN) tablet 4 mg (has no administration in time range)    Or  ondansetron (ZOFRAN) injection 4 mg (has no administration in time range)  dextrose 5 %-0.9 % sodium chloride infusion ( Intravenous Rate/Dose Verify 08/27/18 0823)  morphine 2 MG/ML injection 1 mg (has no administration in time range)  acyclovir (ZOVIRAX) 500 mg in dextrose 5 % 100 mL IVPB (0 mg Intravenous Stopped 08/27/18 0821)  docusate sodium (COLACE) capsule 100 mg (100 mg Oral Given 08/27/18 1039)   polyethylene glycol (MIRALAX / GLYCOLAX) packet 17 g (17 g Oral Given 08/27/18 1040)  bisacodyl (DULCOLAX) suppository 10 mg (has no administration in time range)  acetaminophen (TYLENOL) suppository 650 mg (650 mg Rectal Given 08/27/18 0313)    Mobility walks Low fall risk    Focused Assessment  R Recommendations: See Admitting Provider Note  Report given to:  Gabe RN  Additional Notes:  Hx of esophageal cancer - s/p partial esophagectomy in 2005 Hx of breast cancer Shingles acyclovir per primary service PSBO  patient reports having bowel function yesterday and  abdominal exam negative today mobilize as tolerated NGT not recommended at this time as patient is not nauseated or distended There is abdominal tenderness (mild) in the left upper quadrant and left lower quadrant.  Rash present. Rash is vesicular.  Dermatomal distribution of vesicular rash along L flank  Pt will have repeat abdominal XR in the AM

## 2018-08-27 NOTE — H&P (Signed)
History and Physical    Angelica Roberts Angelica Roberts DOB: [birth year] DOA: 08/26/2018  PCP: Donald Prose, MD  Patient coming from: Home.  Chief Complaint: Abdominal pain.  HPI: Angelica Roberts is a 85 y.o. female with history of hyperlipidemia and previous history of esophageal cancer and breast cancer in remission presents to the ER with complaint of abdominal pain.  Patient states over the last 1 week patient has been having left lower quadrant abdominal pain which increased on lying down.  Had no nausea vomiting.  Had 3 bowel movements yesterday.  Had gone to her PCP and was prescribed antibiotics for possible UTI last week.  Since symptoms did not improve and had gone to the PCP yesterday and found to have a new rash on the left lower quadrant which is concerning for herpes zoster and was placed on Valtrex.  Since patient's pain is concerning was referred to the ER.  ED Course: CAT scan of the abdomen pelvis done shows possibility of partial small bowel obstruction.  Loops of bowel passes through right periumbilical hernia with no obstruction within the hernia.  For possible herpes zoster patient is being started on IV acyclovir.  Patient admitted for further management.  Patient states since admission abdomen has become more soft.  Was distended earlier.  Review of Systems: As per HPI, rest all negative.   Past Medical History:  Diagnosis Date  . Anemia   . Arthritis    osteoarthritis  . Bladder disorder June 2012   Sling  . Bone spur of toe    right big toe  . Cancer ([place]) 02/2004   Esophageal , Skin cancer - basal cell  . Carotid artery occlusion    Bruit  . Constipation   . 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 Gait abnormality   . GERD (gastroesophageal reflux disease)   . Glaucoma   . Hearing impaired   . History of hiatal hernia   . History of kidney stones   . Incontinence of bowel   . Kidney stone   . Osteoporosis   . Personal history of radiation  therapy   . Postmenopausal HRT (hormone replacement therapy)   . Radiation 11/12/13-12/03/13   left breast 42.72 gray  . Wears partial dentures    partial - lower    Past Surgical History:  Procedure Laterality Date  . BREAST BIOPSY Left 09/21/2013  . BREAST LUMPECTOMY Left 10/09/2013  . BREAST LUMPECTOMY WITH NEEDLE LOCALIZATION Left 10/09/2013   Procedure: BREAST LUMPECTOMY WITH NEEDLE LOCALIZATION;  Surgeon: Merrie Roof, MD;  Location: [facility];  Service: General;  Laterality: Left;  . CHOLECYSTECTOMY  1995   Gall Bladder  . COLONOSCOPY  2012   per patient  . dental     dental implants  . 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 EXTRACORPOREAL SHOCK WAVE LITHOTRIPSY Right 02/25/2017   Procedure: RIGHT EXTRACORPOREAL SHOCK WAVE LITHOTRIPSY (ESWL);  Surgeon: Kathie Rhodes, MD;  Location: [facility];  Service: Urology;  Laterality: Right;  . EYE SURGERY Bilateral 2017   cataracts  . FOOT SURGERY Right July 2013   Bone spur  . HERNIA REPAIR  1982   corrective surgery  . INCONTINENCE SURGERY  june 2012  . LITHOTRIPSY    . VAGINAL HYSTERECTOMY  1977     reports that she has never smoked. She has never used smokeless tobacco. She reports that she does not drink alcohol  or use drugs.  Allergies  Allergen Reactions  . Shellfish Allergy Diarrhea, Nausea Only, Swelling and Rash  . Iodine Rash    Topical iodine    Family History  Problem Relation Age of Onset  . Hernia Mother        Hiatal  . Diabetes Mother   . Heart attack Mother 19  . Stroke Mother 80  . Hypertension Father   . Stroke Father 16  . Diabetes Father   . Hyperlipidemia Father   . Stroke Sister   . Breast cancer Sister 10       TAH/BSO @ 48; currently 89  . Colon cancer Brother 78       lung cancer @ 55; currently 100  . Heart attack Maternal Grandmother   . Hernia Maternal Grandmother   . Stroke Paternal Grandmother 12  . Stroke Paternal Grandfather 26  . Cancer Brother         Esophageal in 75s; deceased at 53; non-smoker  . Cancer Maternal Grandfather        GI cancer in 24s; deceased 8s  . Breast cancer Maternal Aunt        dx 56s; deceased late 3s  . Cancer Paternal Uncle        2 pat uncles with GI cancer in late 70s/80  . Cancer Cousin        2 pat cousins with GI cancer <50yo (sons of uncle with GI cancer)    Prior to Admission medications   Medication Sig Start Date End Date Taking? Authorizing Provider  acetaminophen (TYLENOL) 500 MG tablet Take 1,000 mg by mouth 4 (four) times daily.    Yes [provider]  aspirin EC 81 MG tablet Take 81 mg by mouth daily.   Yes [provider]  b complex vitamins tablet Take 1 tablet by mouth daily.   Yes [provider]  cephALEXin (KEFLEX) 500 MG capsule Take 500 mg by mouth 2 (two) times daily. 08/20/18 08/27/18 Yes [provider]  Cholecalciferol (VITAMIN D3) 125 MCG (5000 UT) CAPS Take 5,000 Units by mouth daily.   Yes [provider]  ciprofloxacin (CIPRO) 500 MG tablet Take 500 mg by mouth 2 (two) times daily. FOR 7 DAYS 08/26/18 09/01/18 Yes [provider]  denosumab (PROLIA) 60 MG/ML SOLN injection Inject 60 mg into the skin every 6 (six) months. Administer in upper arm, thigh, or abdomen   Yes [provider]  esomeprazole (NEXIUM) 40 MG capsule Take 40 mg by mouth daily before breakfast.    Yes [provider]  famotidine-calcium carbonate-magnesium hydroxide (PEPCID COMPLETE) 10-800-165 MG chewable tablet Chew 1 tablet by mouth daily.   Yes [provider]  gabapentin (NEURONTIN) 100 MG capsule Take 100 mg by mouth 3 (three) times daily. 08/26/18  Yes [provider]  latanoprost (XALATAN) 0.005 % ophthalmic solution Place 1 drop into both eyes at bedtime.   Yes [provider]  loratadine (CLARITIN) 10 MG tablet Take 10 mg by mouth daily.   Yes [provider]  Multiple Minerals (CALCIUM-MAGNESIUM-ZINC)  TABS Take 1 tablet by mouth daily.   Yes [provider]  Multiple Vitamins-Minerals (HAIR/SKIN/NAILS) CAPS Take 1 capsule by mouth daily.   Yes [provider]  rosuvastatin (CRESTOR) 10 MG tablet Take 10 mg by mouth daily.   Yes [provider]  traMADol (ULTRAM) 50 MG tablet Take 50 mg by mouth every 8 (eight) hours as needed (for pain).   Yes  [provider]  valACYclovir (VALTREX) 1000 MG tablet Take 1,000 mg by mouth 3 (three) times daily. FOR 10 DAYS 08/26/18 09/04/18 Yes [provider]  ibuprofen (ADVIL,MOTRIN) 200 MG tablet Take 200 mg by mouth at bedtime.    [provider]    Physical Exam: Vitals:   08/26/18 2345 08/27/18 0000 08/27/18 0015 08/27/18 0227  BP: (!) 124/57 (!) 121/57 125/61 (!) 102/59  Pulse: 81 83 82 92  Resp:   18 16  Temp:    98.2 F (36.8 C)  TempSrc:    Oral  SpO2: 93% 94% 94% 95%      Constitutional: Moderately built and nourished. Vitals:   08/26/18 2345 08/27/18 0000 08/27/18 0015 08/27/18 0227  BP: (!) 124/57 (!) 121/57 125/61 (!) 102/59  Pulse: 81 83 82 92  Resp:   18 16  Temp:    98.2 F (36.8 C)  TempSrc:    Oral  SpO2: 93% 94% 94% 95%   Eyes: Anicteric no pallor. ENMT: No discharge from the ears eyes nose or mouth. Neck: No mass felt.  No neck rigidity. Respiratory: No rhonchi or crepitations. Cardiovascular: S1-S2 heard. Abdomen: Soft mildly distended abdominal hernia does not look obstructed. Musculoskeletal: No edema. Skin: No rash. Neurologic: Alert awake oriented to time place and person.  Moves all extremities. Psychiatric: Appears normal per normal affect.   Labs on Admission: I have personally reviewed following labs and imaging studies  CBC: Recent Labs  Lab 08/26/18 2043  WBC 7.2  HGB 12.3  HCT 38.6  MCV 93.7  PLT 176   Basic Metabolic Panel: Recent Labs  Lab 08/26/18 2043  NA 136  K 3.9  CL 107  CO2 23  GLUCOSE 123*  BUN 15  CREATININE 0.86    CALCIUM 9.6   GFR: CrCl cannot be calculated (Unknown ideal weight.). Liver Function Tests: Recent Labs  Lab 08/26/18 2043  AST 29  ALT 23  ALKPHOS 63  BILITOT 0.7  PROT 6.6  ALBUMIN 3.6   Recent Labs  Lab 08/26/18 2043  LIPASE 34   No results for input(s): AMMONIA in the last 168 hours. Coagulation Profile: No results for input(s): INR, PROTIME in the last 168 hours. Cardiac Enzymes: No results for input(s): CKTOTAL, CKMB, CKMBINDEX, TROPONINI in the last 168 hours. BNP (last 3 results) No results for input(s): PROBNP in the last 8760 hours. HbA1C: No results for input(s): HGBA1C in the last 72 hours. CBG: No results for input(s): GLUCAP in the last 168 hours. Lipid Profile: No results for input(s): CHOL, HDL, LDLCALC, TRIG, CHOLHDL, LDLDIRECT in the last 72 hours. Thyroid Function Tests: No results for input(s): TSH, T4TOTAL, FREET4, T3FREE, THYROIDAB in the last 72 hours. Anemia Panel: No results for input(s): VITAMINB12, FOLATE, FERRITIN, TIBC, IRON, RETICCTPCT in the last 72 hours. Urine analysis:    Component Value Date/Time   COLORURINE YELLOW 08/26/2018 2013   APPEARANCEUR CLEAR 08/26/2018 2013   LABSPEC 1.038 (H) 08/26/2018 2013   PHURINE 5.0 08/26/2018 2013   GLUCOSEU NEGATIVE 08/26/2018 2013   HGBUR SMALL (A) 08/26/2018 2013   BILIRUBINUR NEGATIVE 08/26/2018 2013   [place] NEGATIVE 08/26/2018 2013   PROTEINUR NEGATIVE 08/26/2018 2013   NITRITE NEGATIVE 08/26/2018 2013   LEUKOCYTESUR MODERATE (A) 08/26/2018 2013   Sepsis Labs: @LABRCNTIP (procalcitonin:4,lacticidven:4) )No results found for this or any previous visit (from the past 240 hour(s)).   Radiological Exams on Admission: Ct Abdomen Pelvis W Contrast  Result Date: 08/26/2018 CLINICAL DATA:  85 year old female with  LEFT abdominal and pelvic pain for 1 week. History of esophageal cancer, esophagogastrectomy and LEFT breast cancer. EXAM: CT ABDOMEN AND PELVIS WITH CONTRAST TECHNIQUE:  Multidetector CT imaging of the abdomen and pelvis was performed using the standard protocol following bolus administration of intravenous contrast. CONTRAST:  154mL ISOVUE-300 IOPAMIDOL (ISOVUE-300) INJECTION 61% COMPARISON:  02/12/2017 CT FINDINGS: Lower chest: No acute abnormality. Esophagogastrectomy changes again noted. Hepatobiliary: The liver is unremarkable. No biliary dilatation. The patient is status post cholecystectomy. Pancreas: Unremarkable Spleen: Unremarkable Adrenals/Urinary Tract: Moderate RIGHT hydronephrosis has slightly decreased since 02/12/2017. The previously identified RIGHT UPJ calculus is no longer present. No obstructing urinary calculi identified. The LEFT kidney, adrenal glands and bladder are unremarkable. Stomach/Bowel: There are dilated proximal and mid small bowel loops noted with collapsed distal small bowel loops. No discrete transition point is identified. A loop of dilated small bowel enters an unchanged RIGHT paraumbilical hernia but no discrete transition point is identified at this hernia. There is gradual tapering of the small bowel distal to the hernia with mild circumferential wall thickening of a small bowel loop within the anterior LEFT abdomen. No evidence of pneumoperitoneum, bowel inflammatory changes or other definite bowel wall thickening. The appendix is normal. Vascular/Lymphatic: Aortic atherosclerosis. No enlarged abdominal or pelvic lymph nodes. Reproductive: Status post hysterectomy. No adnexal masses. Other: Pelvic floor laxity again noted.  No ascites or abscess. Musculoskeletal: No acute or suspicious bony abnormalities. IMPRESSION: 1. Dilated proximal and mid small bowel loops with gradual tapering and nondistended distal small bowel loops. Circumferential wall thickening of small bowel loop in the anterior LEFT abdomen which may represent enteritis/inflammation. Dilated small bowel appearance and pattern likely represents at least a partial small bowel  obstruction. Loop of dilated small bowel enters and unchanged RIGHT paraumbilical hernia but no discrete transition point at this site. No evidence of pneumoperitoneum, ascites or abscess. 2. Moderate RIGHT hydronephrosis which has slightly decreased since 02/12/2017. Previously identified RIGHT UPJ calculus is no longer visualized. No obstructing urinary calculi. 3.  Aortic Atherosclerosis (ICD10-I70.0). Electronically Signed   By: Margarette Canada M.D.   On: 08/26/2018 13:59     Assessment/Plan Principal Problem:   SBO (small bowel obstruction) (HCC) Active Problems:   DCIS (ductal carcinoma in situ) of breast   History of esophageal cancer   Shingles    1. Possible partial small bowel obstruction -we will keep patient n.p.o. get surgical consult.  IV fluids pain relief medications. 2. Herpes zoster -since patient is n.p.o. we will keep patient on IV acyclovir until patient can take orally.  Airborne precautions. 3. History of breast cancer and esophageal cancer in remission. 4. History of hyperlipidemia presently n.p.o.   DVT prophylaxis: SCDs in anticipation of possible procedure. Code Status: Full code. Family Communication: Family at the bedside. Disposition Plan: Home. Consults called: We will consult general surgery. Admission status: Inpatient.   Rise Patience MD [facility] Pager [phone].  If 7PM-7AM, please contact night-coverage [email or web address] Password [facility]  08/27/2018, 2:53 AM

## 2018-08-27 NOTE — ED Notes (Signed)
Lunch tray ordered 

## 2018-08-27 NOTE — Progress Notes (Signed)
Pharmacy Anti-infective Note  Angelica Roberts is a 85 y.o. female admitted on 08/26/2018 with SBO and herpes zoster.  Pharmacy has been consulted for acyclovir dosing.  Plan: Acyclovir 500mg  IV Q12H.  Transition to PO when SBO resolved.  Height: 5\' 2"  (157.5 cm) Weight: 132 lb (59.9 kg) IBW/kg (Calculated) : 50.1  Temp (24hrs), Avg:98.1 F (36.7 C), Min:98 F (36.7 C), Max:98.2 F (36.8 C)  Recent Labs  Lab 08/26/18 2043 08/27/18 0326  WBC 7.2 6.7  CREATININE 0.86  --     Estimated Creatinine Clearance: 39.9 mL/min (by C-G formula based on SCr of 0.86 mg/dL).    Allergies  Allergen Reactions  . Shellfish Allergy Diarrhea, Nausea Only, Swelling and Rash  . Iodine Rash    Topical iodine    Thank you for allowing pharmacy to be a part of this patient's care.  Wynona Neat, PharmD, BCPS  08/27/2018 3:57 AM

## 2018-08-27 NOTE — ED Notes (Signed)
Attempted report x1. 

## 2018-08-27 NOTE — Progress Notes (Signed)
[facility] PROGRESS NOTE    Progress Note  [name]  WCB:[id number] DOB: 12-Feb-1934 DOA: 08/26/2018 PCP: Donald Prose, MD     Brief Narrative:   Angelica Roberts is an 85 y.o. female past medical history of hyperlipidemia, history of esophageal cancer and breast cancer in remission comes to the ER complaining of abdominal pain over the last week she was found to have a new rash at the PCPs office.  Assessment/Plan:   Shingles: We will allow a diet change ACyclovir to oral.  SBO (small bowel obstruction) ([place]): Unlikely small bowel obstruction she had a bowel movement yesterday. She is not nauseated. No NG tube. Surgery was consulted recommended abdominal x-ray. Patient is tolerating her diet passing gas. Will advance her diet  DCIS (ductal carcinoma in situ) of breast/History of esophageal cancer: Follow-up with oncology as an outpatient continue current medications.    DVT prophylaxis: lovenxo Family Communication:son Disposition Plan/Barrier to D/C: home in am Code Status:     Code Status Orders  (From admission, onward)         Start     Ordered   08/27/18 0250  Full code  Continuous     08/27/18 0253        Code Status History    This patient has a current code status but no historical code status.        IV Access:    Peripheral IV   Procedures and diagnostic studies:   Ct Abdomen Pelvis W Contrast  Result Date: 08/26/2018 CLINICAL DATA:  85 year old female with LEFT abdominal and pelvic pain for 1 week. History of esophageal cancer, esophagogastrectomy and LEFT breast cancer. EXAM: CT ABDOMEN AND PELVIS WITH CONTRAST TECHNIQUE: Multidetector CT imaging of the abdomen and pelvis was performed using the standard protocol following bolus administration of intravenous contrast. CONTRAST:  176mL ISOVUE-300 IOPAMIDOL (ISOVUE-300) INJECTION 61% COMPARISON:  02/12/2017 CT FINDINGS: Lower chest: No acute abnormality. Esophagogastrectomy changes  again noted. Hepatobiliary: The liver is unremarkable. No biliary dilatation. The patient is status post cholecystectomy. Pancreas: Unremarkable Spleen: Unremarkable Adrenals/Urinary Tract: Moderate RIGHT hydronephrosis has slightly decreased since 02/12/2017. The previously identified RIGHT UPJ calculus is no longer present. No obstructing urinary calculi identified. The LEFT kidney, adrenal glands and bladder are unremarkable. Stomach/Bowel: There are dilated proximal and mid small bowel loops noted with collapsed distal small bowel loops. No discrete transition point is identified. A loop of dilated small bowel enters an unchanged RIGHT paraumbilical hernia but no discrete transition point is identified at this hernia. There is gradual tapering of the small bowel distal to the hernia with mild circumferential wall thickening of a small bowel loop within the anterior LEFT abdomen. No evidence of pneumoperitoneum, bowel inflammatory changes or other definite bowel wall thickening. The appendix is normal. Vascular/Lymphatic: Aortic atherosclerosis. No enlarged abdominal or pelvic lymph nodes. Reproductive: Status post hysterectomy. No adnexal masses. Other: Pelvic floor laxity again noted.  No ascites or abscess. Musculoskeletal: No acute or suspicious bony abnormalities. IMPRESSION: 1. Dilated proximal and mid small bowel loops with gradual tapering and nondistended distal small bowel loops. Circumferential wall thickening of small bowel loop in the anterior LEFT abdomen which may represent enteritis/inflammation. Dilated small bowel appearance and pattern likely represents at least a partial small bowel obstruction. Loop of dilated small bowel enters and unchanged RIGHT paraumbilical hernia but no discrete transition point at this site. No evidence of pneumoperitoneum, ascites or abscess. 2. Moderate RIGHT hydronephrosis which has slightly decreased since 02/12/2017. Previously  identified RIGHT UPJ calculus is no  longer visualized. No obstructing urinary calculi. 3.  Aortic Atherosclerosis (ICD10-I70.0). Electronically Signed   By: Margarette Canada M.D.   On: 08/26/2018 13:59     Medical Consultants:    None.  Anti-Infectives:   acyclovir  Subjective:    Angelica Roberts she relates she is passing gas. Tolerating her diet  Objective:    Vitals:   08/27/18 1045 08/27/18 1100 08/27/18 1115 08/27/18 1130  BP: (!) 103/56 107/61 (!) 116/50 (!) 117/50  Pulse: 81 75 74 72  Resp:  16  18  Temp:      TempSrc:      SpO2: 97% 98% 97% 95%  Weight:      Height:       No intake or output data in the 24 hours ending 08/27/18 1252 Filed Weights   08/27/18 0330  Weight: 59.9 kg    Exam: General exam: In no acute distress. Respiratory system: Good air movement and clear to auscultation. Cardiovascular system: S1 & S2 heard, RRR.  Gastrointestinal system: Abdomen is nondistended, soft and nontender.  Central nervous system: Alert and oriented. No focal neurological deficits. Extremities: No pedal edema. Skin: No rashes, lesions or ulcers Psychiatry: Judgement and insight appear normal. Mood & affect appropriate.    Data Reviewed:    Labs: Basic Metabolic Panel: Recent Labs  Lab 08/26/18 2043 08/27/18 0326  NA 136 137  K 3.9 3.8  CL 107 109  CO2 23 22  GLUCOSE 123* 129*  BUN 15 15  CREATININE 0.86 0.75  CALCIUM 9.6 8.9   GFR Estimated Creatinine Clearance: 42.9 mL/min (by C-G formula based on SCr of 0.75 mg/dL). Liver Function Tests: Recent Labs  Lab 08/26/18 2043 08/27/18 0326  AST 29 31  ALT 23 22  ALKPHOS 63 60  BILITOT 0.7 0.6  PROT 6.6 6.1*  ALBUMIN 3.6 3.2*   Recent Labs  Lab 08/26/18 2043  LIPASE 34   No results for input(s): AMMONIA in the last 168 hours. Coagulation profile No results for input(s): INR, PROTIME in the last 168 hours.  CBC: Recent Labs  Lab 08/26/18 2043 08/27/18 0326  WBC 7.2 6.7  NEUTROABS  --  4.8  HGB 12.3 11.2*  HCT 38.6  34.8*  MCV 93.7 91.8  PLT 224 205   Cardiac Enzymes: No results for input(s): CKTOTAL, CKMB, CKMBINDEX, TROPONINI in the last 168 hours. BNP (last 3 results) No results for input(s): PROBNP in the last 8760 hours. CBG: No results for input(s): GLUCAP in the last 168 hours. D-Dimer: No results for input(s): DDIMER in the last 72 hours. Hgb A1c: No results for input(s): HGBA1C in the last 72 hours. Lipid Profile: No results for input(s): CHOL, HDL, LDLCALC, TRIG, CHOLHDL, LDLDIRECT in the last 72 hours. Thyroid function studies: No results for input(s): TSH, T4TOTAL, T3FREE, THYROIDAB in the last 72 hours.  Invalid input(s): FREET3 Anemia work up: No results for input(s): VITAMINB12, FOLATE, FERRITIN, TIBC, IRON, RETICCTPCT in the last 72 hours. Sepsis Labs: Recent Labs  Lab 08/26/18 2043 08/27/18 0326  WBC 7.2 6.7   Microbiology No results found for this or any previous visit (from the past 240 hour(s)).   Medications:   . docusate sodium  100 mg Oral BID  . latanoprost  1 drop Both Eyes QHS  . polyethylene glycol  17 g Oral Daily  . sodium chloride flush  3 mL Intravenous Once   Continuous Infusions: . acyclovir Stopped (08/27/18 0821)  .  dextrose 5 % and 0.9% NaCl 75 mL/hr at 08/27/18 0823      LOS: 0 days   Charlynne Cousins  [facility]  08/27/2018, 12:52 PM

## 2018-08-27 NOTE — ED Notes (Signed)
Lunch tray at bedside. ?

## 2018-08-28 ENCOUNTER — Inpatient Hospital Stay (HOSPITAL_COMMUNITY): Payer: Medicare HMO

## 2018-08-28 DIAGNOSIS — K56600 Partial intestinal obstruction, unspecified as to cause: Principal | ICD-10-CM

## 2018-08-28 LAB — BASIC METABOLIC PANEL
Anion gap: 7 (ref 5–15)
BUN: 7 mg/dL — ABNORMAL LOW (ref 8–23)
CALCIUM: 8.4 mg/dL — AB (ref 8.9–10.3)
CO2: 23 mmol/L (ref 22–32)
Chloride: 111 mmol/L (ref 98–111)
Creatinine, Ser: 0.73 mg/dL (ref 0.44–1.00)
GFR calc Af Amer: >60 mL/min (ref >60)
GFR calc non Af Amer: >60 mL/min (ref >60)
Glucose, Bld: 113 mg/dL — ABNORMAL HIGH (ref 70–99)
Potassium: 4.1 mmol/L (ref 3.5–5.1)
Sodium: 141 mmol/L (ref 135–145)

## 2018-08-28 MED ORDER — GABAPENTIN 100 MG PO CAPS
100.0000 mg | ORAL_CAPSULE | Freq: Three times a day (TID) | ORAL | Status: DC
  Administered 2018-08-28: 100 mg via ORAL
  Filled 2018-08-28: qty 1

## 2018-08-28 MED ORDER — ACYCLOVIR 800 MG PO TABS: 800.0000 mg | ORAL_TABLET | Freq: Two times a day (BID) | ORAL | 0 refills | Status: DC

## 2018-08-28 MED ORDER — GABAPENTIN 100 MG PO CAPS: 100.0000 mg | ORAL_CAPSULE | Freq: Three times a day (TID) | ORAL | Status: DC

## 2018-08-28 NOTE — Discharge Planning (Signed)
Patient discharged home in stable condition. Verbalizes understanding of all discharge instructions, including home medications and follow up appointments. 

## 2018-08-28 NOTE — Discharge Summary (Addendum)
Physician Discharge Summary  Angelica Roberts QQV:[id number] DOB: [birth year] DOA: 08/26/2018  PCP: Angelica Prose, MD  Admit date: 08/26/2018 Discharge date: 08/28/2018  Admitted From: Home Disposition:  Home  Recommendations for Outpatient Follow-up:  1. Follow up with PCP in 1-2 weeks 2. Please obtain BMP/CBC in one week   Home Health:No Equipment/Devices:None  Discharge Condition:Stable CODE STATUS:Full Diet recommendation: Heart Healthy   Brief/Interim Summary:  85 y.o. female past medical history of hyperlipidemia, history of esophageal cancer and breast cancer in remission comes to the ER complaining of abdominal pain over the last week she was found to have a new rash at the PCPs office.  Discharge Diagnoses:  Principal Problem:   SBO (small bowel obstruction) (HCC) Active Problems:   DCIS (ductal carcinoma in situ) of breast   History of esophageal cancer   Shingles  Shingles in a single dermatome: She will start on IV acyclovir when she was able to take orals she was changed to oral acyclovir which she will continue at home as an outpatient for total of 5 days.  Small bowel obstruction: NG tube was not placed as she was now vomiting and CT scan showed possible small bowel. She was had a bowel movement she was able to tolerate her diet he was monitored overnight and her diet was advanced. Surgery was consulted which agreed with management.  Ductal carcinoma in situs of breast: Follow-up with oncology as an outpatient.  Discharge Instructions  Discharge Instructions    Diet - low sodium heart healthy   Complete by:  As directed    Increase activity slowly   Complete by:  As directed      Allergies as of 08/28/2018      Reactions   Shellfish Allergy Diarrhea, Nausea Only, Swelling, Rash   Iodine Rash   Topical iodine      Medication List    STOP taking these medications   cephALEXin 500 MG capsule Commonly known as:  KEFLEX   ciprofloxacin 500 MG  tablet Commonly known as:  CIPRO     TAKE these medications   acetaminophen 500 MG tablet Commonly known as:  TYLENOL Take 1,000 mg by mouth 4 (four) times daily.   aspirin EC 81 MG tablet Take 81 mg by mouth daily.   b complex vitamins tablet Take 1 tablet by mouth daily.   Calcium-Magnesium-Zinc Tabs Take 1 tablet by mouth daily.   denosumab 60 MG/ML Soln injection Commonly known as:  PROLIA Inject 60 mg into the skin every 6 (six) months. Administer in upper arm, thigh, or abdomen   esomeprazole 40 MG capsule Commonly known as:  NEXIUM Take 40 mg by mouth daily before breakfast.   famotidine-calcium carbonate-magnesium hydroxide 10-800-165 MG chewable tablet Commonly known as:  PEPCID COMPLETE Chew 1 tablet by mouth daily.   gabapentin 100 MG capsule Commonly known as:  NEURONTIN Take 100 mg by mouth 3 (three) times daily.   Hair/Skin/Nails Caps Take 1 capsule by mouth daily.   ibuprofen 200 MG tablet Commonly known as:  ADVIL,MOTRIN Take 200 mg by mouth at bedtime.   loratadine 10 MG tablet Commonly known as:  CLARITIN Take 10 mg by mouth daily.   rosuvastatin 10 MG tablet Commonly known as:  CRESTOR Take 10 mg by mouth daily.   traMADol 50 MG tablet Commonly known as:  ULTRAM Take 50 mg by mouth every 8 (eight) hours as needed (for pain).   valACYclovir 1000 MG tablet Commonly known as:  VALTREX Take  1,000 mg by mouth 3 (three) times daily. FOR 10 DAYS   Vitamin D3 125 MCG (5000 UT) Caps Take 5,000 Units by mouth daily.   Xalatan 0.005 % ophthalmic solution Generic drug:  latanoprost Place 1 drop into both eyes at bedtime.       Allergies  Allergen Reactions  . Shellfish Allergy Diarrhea, Nausea Only, Swelling and Rash  . Iodine Rash    Topical iodine    Consultations:  General surgery   Procedures/Studies: Ct Abdomen Pelvis W Contrast  Result Date: 08/26/2018 CLINICAL DATA:  85 year old female with LEFT abdominal and pelvic pain  for 1 week. History of esophageal cancer, esophagogastrectomy and LEFT breast cancer. EXAM: CT ABDOMEN AND PELVIS WITH CONTRAST TECHNIQUE: Multidetector CT imaging of the abdomen and pelvis was performed using the standard protocol following bolus administration of intravenous contrast. CONTRAST:  148mL ISOVUE-300 IOPAMIDOL (ISOVUE-300) INJECTION 61% COMPARISON:  02/12/2017 CT FINDINGS: Lower chest: No acute abnormality. Esophagogastrectomy changes again noted. Hepatobiliary: The liver is unremarkable. No biliary dilatation. The patient is status post cholecystectomy. Pancreas: Unremarkable Spleen: Unremarkable Adrenals/Urinary Tract: Moderate RIGHT hydronephrosis has slightly decreased since 02/12/2017. The previously identified RIGHT UPJ calculus is no longer present. No obstructing urinary calculi identified. The LEFT kidney, adrenal glands and bladder are unremarkable. Stomach/Bowel: There are dilated proximal and mid small bowel loops noted with collapsed distal small bowel loops. No discrete transition point is identified. A loop of dilated small bowel enters an unchanged RIGHT paraumbilical hernia but no discrete transition point is identified at this hernia. There is gradual tapering of the small bowel distal to the hernia with mild circumferential wall thickening of a small bowel loop within the anterior LEFT abdomen. No evidence of pneumoperitoneum, bowel inflammatory changes or other definite bowel wall thickening. The appendix is normal. Vascular/Lymphatic: Aortic atherosclerosis. No enlarged abdominal or pelvic lymph nodes. Reproductive: Status post hysterectomy. No adnexal masses. Other: Pelvic floor laxity again noted.  No ascites or abscess. Musculoskeletal: No acute or suspicious bony abnormalities. IMPRESSION: 1. Dilated proximal and mid small bowel loops with gradual tapering and nondistended distal small bowel loops. Circumferential wall thickening of small bowel loop in the anterior LEFT abdomen  which may represent enteritis/inflammation. Dilated small bowel appearance and pattern likely represents at least a partial small bowel obstruction. Loop of dilated small bowel enters and unchanged RIGHT paraumbilical hernia but no discrete transition point at this site. No evidence of pneumoperitoneum, ascites or abscess. 2. Moderate RIGHT hydronephrosis which has slightly decreased since 02/12/2017. Previously identified RIGHT UPJ calculus is no longer visualized. No obstructing urinary calculi. 3.  Aortic Atherosclerosis (ICD10-I70.0). Electronically Signed   By: Margarette Canada M.D.   On: 08/26/2018 13:59   Dg Abd Portable 1v  Result Date: 08/28/2018 CLINICAL DATA:  Small bowel obstruction EXAM: PORTABLE ABDOMEN - 1 VIEW COMPARISON:  CT from 2 days ago FINDINGS: Stool has cleared from prior. Minimally dilated small bowel loops with overall nonobstructive pattern. Postoperative epigastrium and right upper quadrant. IMPRESSION: Bowel gas pattern is within normal limits. Note that partial obstruction can be occult by radiography. Electronically Signed   By: Monte Fantasia M.D.   On: 08/28/2018 09:16   (Echo, Carotid, EGD, Colonoscopy, ERCP)    Subjective:  Please feels great tolerating her diet she had to have a bowel movement this morning. Discharge Exam: Vitals:   08/27/18 2209 08/28/18 0530  BP: (!) 144/65 (!) 130/53  Pulse: 78 70  Resp: 18 18  Temp: 97.6 F (36.4 C) 97.6 F (  36.4 C)  SpO2: 99% 97%     General: Pt is alert, awake, not in acute distress Cardiovascular: RRR, S1/S2 +, no rubs, no gallops Respiratory: CTA bilaterally, no wheezing, no rhonchi Abdominal: Soft, NT, ND, bowel sounds + Extremities: no edema, no cyanosis    The results of significant diagnostics from this hospitalization (including imaging, microbiology, ancillary and laboratory) are listed below for reference.     Microbiology: No results found for this or any previous visit (from the past 240 hour(s)).    Labs: BNP (last 3 results) No results for input(s): BNP in the last 8760 hours. Basic Metabolic Panel: Recent Labs  Lab 08/26/18 2043 08/27/18 0326 08/28/18 0239  NA 136 137 141  K 3.9 3.8 4.1  CL 107 109 111  CO2 23 22 23   GLUCOSE 123* 129* 113*  BUN 15 15 7*  CREATININE 0.86 0.75 0.73  CALCIUM 9.6 8.9 8.4*   Liver Function Tests: Recent Labs  Lab 08/26/18 2043 08/27/18 0326  AST 29 31  ALT 23 22  ALKPHOS 63 60  BILITOT 0.7 0.6  PROT 6.6 6.1*  ALBUMIN 3.6 3.2*   Recent Labs  Lab 08/26/18 2043  LIPASE 34   No results for input(s): AMMONIA in the last 168 hours. CBC: Recent Labs  Lab 08/26/18 2043 08/27/18 0326  WBC 7.2 6.7  NEUTROABS  --  4.8  HGB 12.3 11.2*  HCT 38.6 34.8*  MCV 93.7 91.8  PLT 224 205   Cardiac Enzymes: No results for input(s): CKTOTAL, CKMB, CKMBINDEX, TROPONINI in the last 168 hours. BNP: Invalid input(s): POCBNP CBG: No results for input(s): GLUCAP in the last 168 hours. D-Dimer No results for input(s): DDIMER in the last 72 hours. Hgb A1c No results for input(s): HGBA1C in the last 72 hours. Lipid Profile No results for input(s): CHOL, HDL, LDLCALC, TRIG, CHOLHDL, LDLDIRECT in the last 72 hours. Thyroid function studies No results for input(s): TSH, T4TOTAL, T3FREE, THYROIDAB in the last 72 hours.  Invalid input(s): FREET3 Anemia work up No results for input(s): VITAMINB12, FOLATE, FERRITIN, TIBC, IRON, RETICCTPCT in the last 72 hours. Urinalysis    Component Value Date/Time   COLORURINE YELLOW 08/26/2018 2013   APPEARANCEUR CLEAR 08/26/2018 2013   LABSPEC 1.038 (H) 08/26/2018 2013   PHURINE 5.0 08/26/2018 2013   GLUCOSEU NEGATIVE 08/26/2018 2013   HGBUR SMALL (A) 08/26/2018 2013   BILIRUBINUR NEGATIVE 08/26/2018 2013   [place] 08/26/2018 2013   PROTEINUR NEGATIVE 08/26/2018 2013   NITRITE NEGATIVE 08/26/2018 2013   LEUKOCYTESUR MODERATE (A) 08/26/2018 2013   Sepsis Labs Invalid input(s):  PROCALCITONIN,  WBC,  LACTICIDVEN Microbiology No results found for this or any previous visit (from the past 240 hour(s)).   Time coordinating discharge: 40 minutes  SIGNED:   Charlynne Cousins, MD  [facility]

## 2018-08-28 NOTE — Final Consult Note (Signed)
Consultant Final Sign-Off Note    Assessment/Final recommendations  Angelica Roberts is a 85 y.o. female followed by me for PSBO.    Wound care (if applicable): N/A   Diet at discharge: soft diet   Activity at discharge: per primary team   Follow-up appointment:  None   Pending results:  Unresulted Labs (From admission, onward)   None       Medication recommendations: colace prn    Other recommendations: N/A    Thank you for allowing [place] to participate in the care of your patient!  Please consult [place] again if you have further needs for your patient.  Angelica Roberts 08/28/2018 10:26 AM    Subjective  Patient is tolerating liquid diet and having bowel function. Pain is from shingles, patient describes this as poorly controlled.    Objective  Vital signs in last 24 hours: Temp:  [97.6 F (36.4 C)-98.1 F (36.7 C)] 97.6 F (36.4 C) (03/12 0530) Pulse Rate:  [66-81] 70 (03/12 0530) Resp:  [16-18] 18 (03/12 0530) BP: (103-144)/(49-65) 130/53 (03/12 0530) SpO2:  [93 %-99 %] 97 % (03/12 0530)  Physical Exam  Constitutional: She is well-developed, well-nourished, and in no distress. No distress.  Cardiovascular: Normal rate and regular rhythm.  Pulmonary/Chest: Effort normal and breath sounds normal.  Abdominal: Soft. Bowel sounds are normal. She exhibits no distension. There is no abdominal tenderness. There is no rebound and no guarding.  Musculoskeletal: Normal range of motion.  Skin:  Vesicular rash present to L flank and back, does not cross the midline    Pertinent labs and Studies: Recent Labs    08/26/18 09-18-41 08/27/18 0326  WBC 7.2 6.7  HGB 12.3 11.2*  HCT 38.6 34.8*   BMET Recent Labs    08/27/18 0326 08/28/18 0239  NA 137 141  K 3.8 4.1  CL 109 111  CO2 22 23  GLUCOSE 129* 113*  BUN 15 7*  CREATININE 0.75 0.73  CALCIUM 8.9 8.4*   No results for input(s): LABURIN in the last 72 hours. No results found for this or any previous  visit.  Imaging: Dg Abd Portable 1v  Result Date: 08/28/2018 CLINICAL DATA:  Small bowel obstruction EXAM: PORTABLE ABDOMEN - 1 VIEW COMPARISON:  CT from 2 days ago FINDINGS: Stool has cleared from prior. Minimally dilated small bowel loops with overall nonobstructive pattern. Postoperative epigastrium and right upper quadrant. IMPRESSION: Bowel gas pattern is within normal limits. Note that partial obstruction can be occult by radiography. Electronically Signed   By: Monte Fantasia M.D.   On: 08/28/2018 09:16

## 2018-09-12 ENCOUNTER — Ambulatory Visit: Payer: Self-pay | Admitting: Cardiology

## 2018-09-19 DIAGNOSIS — N3000 Acute cystitis without hematuria: Secondary | ICD-10-CM | POA: Diagnosis not present

## 2018-09-19 DIAGNOSIS — R309 Painful micturition, unspecified: Secondary | ICD-10-CM | POA: Diagnosis not present

## 2018-09-22 ENCOUNTER — Ambulatory Visit (INDEPENDENT_AMBULATORY_CARE_PROVIDER_SITE_OTHER): Payer: Medicare HMO | Admitting: Cardiology

## 2018-09-22 ENCOUNTER — Encounter: Payer: Self-pay | Admitting: Cardiology

## 2018-09-22 ENCOUNTER — Other Ambulatory Visit: Payer: Self-pay

## 2018-09-22 DIAGNOSIS — R0600 Dyspnea, unspecified: Secondary | ICD-10-CM

## 2018-09-22 DIAGNOSIS — I700 Atherosclerosis of aorta: Secondary | ICD-10-CM | POA: Diagnosis not present

## 2018-09-22 DIAGNOSIS — R0609 Other forms of dyspnea: Secondary | ICD-10-CM | POA: Diagnosis not present

## 2018-09-22 DIAGNOSIS — I2510 Atherosclerotic heart disease of native coronary artery without angina pectoris: Secondary | ICD-10-CM | POA: Diagnosis not present

## 2018-09-22 HISTORY — DX: Atherosclerotic heart disease of native coronary artery without angina pectoris: I25.10

## 2018-09-22 HISTORY — DX: Atherosclerosis of aorta: I70.0

## 2018-09-22 MED ORDER — ROSUVASTATIN CALCIUM 20 MG PO TABS: 20.0000 mg | ORAL_TABLET | Freq: Every day | ORAL | 3 refills | Status: DC

## 2018-09-22 NOTE — Progress Notes (Signed)
Virtual Visit via Video Note   Subjective:   Angelica Roberts, female    DOB: 06/13/1934, 85 y.o.   MRN: [id number]   I connected with the patient on 09/22/18 by a telephone call. I verified that I am speaking with the correct person using two identifiers.     I discussed the limitations of evaluation and management by telemedicine and the availability of in person appointments. The patient expressed understanding and agreed to proceed.   This visit type was conducted due to national recommendations for restrictions regarding the COVID-19 Pandemic (e.g. social distancing).  This format is felt to be most appropriate for this patient at this time.  All issues noted in this document were discussed and addressed.  No physical exam was performed (except for noted visual exam findings with Tele health visits).  The patient has consented to conduct a Tele health visit and understands insurance will be billed.    Chief complaint:  Shortness of breath  HPI  85 y/o Caucasian female with h/o esophageal cancer, multiple prior surgeries for conditions including esophageal cancer, hiatal hernia, breast cancer, all in remission, now with incidental finding of aortic and LAD atherosclerosis seen on CT chest peformed for surveillance for lung nodules.  Patient lost her husband after long illness in 07/2018. Following that, she had a hospital admission with small bowel obstruction, and was also found to have shingles and UTI. She recovered from these, but has another UTI for which she is being treated. She does not have any chest pain, but feels that she has had decreased energy. She also has shortness of breath with walking more than half a mile at a time. She denies orthopnea, PND, leg edema symptoms.    Past Medical History:  Diagnosis Date  . Anemia   . Aortic atherosclerosis ([place]) 09/22/2018  . Arthritis    osteoarthritis  . Bladder disorder June 2012   Sling  . Bone spur of toe    right big toe   . Cancer ([place]) 02/2004   Esophageal , Skin cancer - basal cell  . Carotid artery occlusion    Bruit  . Constipation   . Coronary atherosclerosis 09/22/2018  . 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 Gait abnormality   . GERD (gastroesophageal reflux disease)   . Glaucoma   . Hearing impaired   . History of hiatal hernia   . History of kidney stones   . Incontinence of bowel   . Kidney stone   . Osteoporosis   . Personal history of radiation therapy   . Postmenopausal HRT (hormone replacement therapy)   . Radiation 11/12/13-12/03/13   left breast 42.72 gray  . Shingles   . Wears partial dentures    partial - lower     Past Surgical History:  Procedure Laterality Date  . BREAST BIOPSY Left 09/21/2013  . BREAST LUMPECTOMY Left 10/09/2013  . BREAST LUMPECTOMY WITH NEEDLE LOCALIZATION Left 10/09/2013   Procedure: BREAST LUMPECTOMY WITH NEEDLE LOCALIZATION;  Surgeon: Merrie Roof, MD;  Location: [facility];  Service: General;  Laterality: Left;  . CHOLECYSTECTOMY  1995   Gall Bladder  . COLONOSCOPY  2012   per patient  . dental     dental implants  . 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 EXTRACORPOREAL SHOCK WAVE LITHOTRIPSY Right 02/25/2017   Procedure: RIGHT EXTRACORPOREAL SHOCK WAVE LITHOTRIPSY (ESWL);  Surgeon: Kathie Rhodes, MD;  Location:  [facility];  Service: Urology;  Laterality: Right;  . EYE SURGERY Bilateral 2017   cataracts  . FOOT SURGERY Right July 2013   Bone spur  . HERNIA REPAIR  1982   corrective surgery  . INCONTINENCE SURGERY  june 2012  . LITHOTRIPSY    . VAGINAL HYSTERECTOMY  1977     Social History   Socioeconomic History  . Marital status: Widowed    Spouse name: Not on file  . Number of children: 3  . Years of education: Not on file  . Highest education level: Not on file  Occupational History  . Not on file  Social Needs  . Financial resource strain: Not on file  . Food insecurity:    Worry: Not on file     Inability: Not on file  . Transportation needs:    Medical: Not on file    Non-medical: Not on file  Tobacco Use  . Smoking status: Never Smoker  . Smokeless tobacco: Never Used  Substance and Sexual Activity  . Alcohol use: No    Alcohol/week: 0.0 standard drinks  . Drug use: No  . Sexual activity: Not Currently  Lifestyle  . Physical activity:    Days per week: Not on file    Minutes per session: Not on file  . Stress: Not on file  Relationships  . Social connections:    Talks on phone: Not on file    Gets together: Not on file    Attends religious service: Not on file    Active member of club or organization: Not on file    Attends meetings of clubs or organizations: Not on file    Relationship status: Not on file  . Intimate partner violence:    Fear of current or ex partner: Not on file    Emotionally abused: Not on file    Physically abused: Not on file    Forced sexual activity: Not on file  Other Topics Concern  . Not on file  Social History Narrative  . Not on file     Current Outpatient Medications on File Prior to Visit  Medication Sig Dispense Refill  . acetaminophen (TYLENOL) 500 MG tablet Take 1,000 mg by mouth 4 (four) times daily.     Marland Kitchen aspirin EC 81 MG tablet Take 81 mg by mouth daily.    Marland Kitchen b complex vitamins tablet Take 1 tablet by mouth daily.    . Cholecalciferol (VITAMIN D3) 125 MCG (5000 UT) CAPS Take 5,000 Units by mouth daily.    . ciprofloxacin (CIPRO) 500 MG/5ML (10%) suspension Take 500 mg by mouth every 12 (twelve) hours.    Marland Kitchen denosumab (PROLIA) 60 MG/ML SOLN injection Inject 60 mg into the skin every 6 (six) months. Administer in upper arm, thigh, or abdomen    . esomeprazole (NEXIUM) 40 MG capsule Take 40 mg by mouth daily before breakfast.     . famotidine-calcium carbonate-magnesium hydroxide (PEPCID COMPLETE) 10-800-165 MG chewable tablet Chew 1 tablet by mouth daily.    Marland Kitchen gabapentin (NEURONTIN) 100 MG capsule Take 100 mg by mouth 3  (three) times daily.    Marland Kitchen ibuprofen (ADVIL,MOTRIN) 200 MG tablet Take 200 mg by mouth at bedtime.    Marland Kitchen latanoprost (XALATAN) 0.005 % ophthalmic solution Place 1 drop into both eyes at bedtime.    Marland Kitchen loratadine (CLARITIN) 10 MG tablet Take 10 mg by mouth daily.    . Multiple Vitamins-Minerals (HAIR/SKIN/NAILS) CAPS Take 1 capsule by mouth daily.  No current facility-administered medications on file prior to visit.     Cardiovascular studies:  CTA Chest 02/10/2018: 1. While there are several small pulmonary nodules scattered throughout the lungs bilaterally, the majority of these are subpleural in location and are favored to be benign. However, attention on follow-up studies is recommended to ensure continued stability or resolution of these findings. The largest nodule is a subpleural nodule in the posterior left lower lobe (axial image 59 of series 7), with a mean diameter of 10 mm. Non-contrast chest CT at 3-6 months is recommended. If the nodules are stable at time of  repeat CT, then future CT at 18-24 months (from today's scan) is considered optional for low-risk patients, but is recommended for high-risk patients. This recommendation follows the consensus statement: Guidelines for Management of Incidental Pulmonary Nodules Detected on CT Images: From the Fleischner Society 2017; Radiology 2017; 284:228-243. 2. Aortic atherosclerosis, in addition to left anterior descending coronary artery disease. Please note that although the presence of coronary artery calcium documents the presence of coronary artery disease, the severity of this disease and any potential stenosis cannot be assessed on this non-gated CT examination. Assessment for potential risk factor modification, dietary therapy or pharmacologic therapy may be warranted, if clinically indicated.  Recent labs: Results for CASSANDR, CEDERBERG (MRN [id number]) as of 09/22/2018 10:49  Ref. Range 08/28/2018 63:84  BASIC METABOLIC  PANEL Unknown Rpt (A)  Sodium Latest Ref Range: 135 - 145 mmol/L 141  Potassium Latest Ref Range: 3.5 - 5.1 mmol/L 4.1  Chloride Latest Ref Range: 98 - 111 mmol/L 111  CO2 Latest Ref Range: 22 - 32 mmol/L 23  Glucose Latest Ref Range: 70 - 99 mg/dL 113 (H)  BUN Latest Ref Range: 8 - 23 mg/dL 7 (L)  Creatinine Latest Ref Range: 0.44 - 1.00 mg/dL 0.73  Calcium Latest Ref Range: 8.9 - 10.3 mg/dL 8.4 (L)  Anion gap Latest Ref Range: 5 - 15  7  GFR, Est Non African American Latest Ref Range: >60 mL/min >60  GFR, Est African American Latest Ref Range: >60 mL/min >60   Results for TIANI, STANBERY (MRN [id number]) as of 09/22/2018 10:49  Ref. Range 08/28/2018 17:79  BASIC METABOLIC PANEL Unknown Rpt (A)  Sodium Latest Ref Range: 135 - 145 mmol/L 141  Potassium Latest Ref Range: 3.5 - 5.1 mmol/L 4.1  Chloride Latest Ref Range: 98 - 111 mmol/L 111  CO2 Latest Ref Range: 22 - 32 mmol/L 23  Glucose Latest Ref Range: 70 - 99 mg/dL 113 (H)  BUN Latest Ref Range: 8 - 23 mg/dL 7 (L)  Creatinine Latest Ref Range: 0.44 - 1.00 mg/dL 0.73  Calcium Latest Ref Range: 8.9 - 10.3 mg/dL 8.4 (L)  Anion gap Latest Ref Range: 5 - 15  7  GFR, Est Non African American Latest Ref Range: >60 mL/min >60  GFR, Est African American Latest Ref Range: >60 mL/min >60    Review of Systems  Constitution: Negative for decreased appetite, malaise/fatigue, weight gain and weight loss.  HENT: Negative for congestion.   Eyes: Negative for visual disturbance.  Cardiovascular: Positive for dyspnea on exertion. Negative for chest pain, leg swelling, palpitations and syncope.  Respiratory: Positive for shortness of breath.   Endocrine: Negative for cold intolerance.  Hematologic/Lymphatic: Does not bruise/bleed easily.  Skin: Negative for itching and rash.  Musculoskeletal: Negative for myalgias.  Gastrointestinal: Negative for abdominal pain, nausea and vomiting.  Genitourinary: Positive for dysuria.  Neurological: Negative  for dizziness  and weakness.  Psychiatric/Behavioral: The patient is not nervous/anxious.   All other systems reviewed and are negative.       There were no vitals filed for this visit.  Objective:   Physical Exam No physical exam performed. This is a telephone visit.        Assessment & Recommendations:   85 y/o Caucasian female with h/o esophageal cancer, multiple prior surgeries for conditions including esophageal cancer, hiatal hernia, breast cancer, all in remission, now with incidental finding of aortic and LAD atherosclerosis seen on CT chest peformed for surveillance for lung nodules. Now with exertional dyspnea.  Exertional dyspnea: Could be multifactorial. Deconditioning with recent medical illness likely. She does not have any other symptoms to suggest heart failure. Given LAD and aortic atherosclerosis seen on prior CT, CAD is in the differential. I have increase Crestor to 20 mg daily. Continue Aspirin 81 mg daily. I will obtain echocardiogram and stress test in June 2020 when COVID outbreak is under control. She is at high risk of severe outcomes, should she be exposed to [place].   I will plan on seeing her in July 2020 after the above testing.   Nigel Mormon, MD [facility] Pager: [phone] Office: [phone] If no answer Cell [phone]

## 2018-10-07 ENCOUNTER — Telehealth: Payer: Self-pay | Admitting: Adult Health

## 2018-10-07 NOTE — Telephone Encounter (Signed)
R/s appt per 4/20 sch message - unable to reach patient- left message with new appt date and time

## 2018-11-06 ENCOUNTER — Telehealth: Payer: Self-pay | Admitting: Adult Health

## 2018-11-06 NOTE — Telephone Encounter (Signed)
Called patient regarding upcoming Web[facility] appointment, per patient's request the appointment has been cancelled and patient will call back when ready to reschedule.   Message to [place]

## 2018-11-12 ENCOUNTER — Inpatient Hospital Stay: Payer: Medicare HMO | Admitting: Adult Health

## 2018-11-13 ENCOUNTER — Encounter: Payer: Medicare HMO | Admitting: Adult Health

## 2018-11-25 ENCOUNTER — Other Ambulatory Visit: Payer: Self-pay | Admitting: Adult Health

## 2018-11-25 ENCOUNTER — Ambulatory Visit
Admission: RE | Admit: 2018-11-25 | Discharge: 2018-11-25 | Disposition: A | Discharge status: Home / Self Care | Payer: Medicare HMO | Source: Ambulatory Visit | Attending: Adult Health | Admitting: Adult Health

## 2018-11-25 ENCOUNTER — Other Ambulatory Visit: Payer: Self-pay

## 2018-11-25 DIAGNOSIS — N6310 Unspecified lump in the right breast, unspecified quadrant: Secondary | ICD-10-CM

## 2018-11-25 DIAGNOSIS — N6313 Unspecified lump in the right breast, lower outer quadrant: Secondary | ICD-10-CM | POA: Diagnosis not present

## 2018-11-25 DIAGNOSIS — N6311 Unspecified lump in the right breast, upper outer quadrant: Secondary | ICD-10-CM | POA: Diagnosis not present

## 2018-11-25 DIAGNOSIS — R928 Other abnormal and inconclusive findings on diagnostic imaging of breast: Secondary | ICD-10-CM | POA: Diagnosis not present

## 2018-12-01 ENCOUNTER — Other Ambulatory Visit: Payer: Medicare HMO

## 2018-12-08 ENCOUNTER — Other Ambulatory Visit: Payer: Medicare HMO

## 2018-12-15 ENCOUNTER — Other Ambulatory Visit: Payer: Self-pay | Admitting: Adult Health

## 2018-12-15 ENCOUNTER — Ambulatory Visit
Admission: RE | Admit: 2018-12-15 | Discharge: 2018-12-15 | Disposition: A | Discharge status: Home / Self Care | Payer: Medicare HMO | Source: Ambulatory Visit | Attending: Adult Health | Admitting: Adult Health

## 2018-12-15 ENCOUNTER — Other Ambulatory Visit: Payer: Self-pay

## 2018-12-15 DIAGNOSIS — N6315 Unspecified lump in the right breast, overlapping quadrants: Secondary | ICD-10-CM | POA: Diagnosis not present

## 2018-12-15 DIAGNOSIS — N6310 Unspecified lump in the right breast, unspecified quadrant: Secondary | ICD-10-CM

## 2018-12-15 DIAGNOSIS — N6011 Diffuse cystic mastopathy of right breast: Secondary | ICD-10-CM | POA: Diagnosis not present

## 2018-12-15 DIAGNOSIS — N6311 Unspecified lump in the right breast, upper outer quadrant: Secondary | ICD-10-CM | POA: Diagnosis not present

## 2018-12-15 DIAGNOSIS — C50811 Malignant neoplasm of overlapping sites of right female breast: Secondary | ICD-10-CM | POA: Diagnosis not present

## 2018-12-18 ENCOUNTER — Other Ambulatory Visit: Payer: Self-pay | Admitting: Oncology

## 2018-12-22 ENCOUNTER — Ambulatory Visit: Payer: Medicare HMO

## 2018-12-22 ENCOUNTER — Other Ambulatory Visit: Payer: Self-pay

## 2018-12-23 ENCOUNTER — Telehealth: Payer: Self-pay | Admitting: Cardiology

## 2018-12-23 NOTE — Telephone Encounter (Signed)
Spoke with the patient. I was informed that the stress test could not be performed due to vascular access issues. She would like to hold off any cardiac testing. She would like to see me on as needed basis.  Nigel Mormon, MD

## 2018-12-24 ENCOUNTER — Encounter: Payer: Self-pay | Admitting: Adult Health

## 2018-12-24 DIAGNOSIS — C50411 Malignant neoplasm of upper-outer quadrant of right female breast: Secondary | ICD-10-CM | POA: Insufficient documentation

## 2018-12-24 DIAGNOSIS — Z170 Estrogen receptor positive status [ER+]: Secondary | ICD-10-CM | POA: Insufficient documentation

## 2018-12-29 ENCOUNTER — Ambulatory Visit: Payer: Medicare HMO | Admitting: Cardiology

## 2018-12-30 ENCOUNTER — Other Ambulatory Visit: Payer: Self-pay | Admitting: Oncology

## 2018-12-30 ENCOUNTER — Ambulatory Visit: Payer: Self-pay | Admitting: General Surgery

## 2018-12-30 DIAGNOSIS — Z170 Estrogen receptor positive status [ER+]: Secondary | ICD-10-CM

## 2018-12-30 DIAGNOSIS — C50411 Malignant neoplasm of upper-outer quadrant of right female breast: Secondary | ICD-10-CM

## 2019-01-06 ENCOUNTER — Other Ambulatory Visit: Payer: Medicare HMO

## 2019-01-06 ENCOUNTER — Telehealth: Payer: Self-pay | Admitting: Oncology

## 2019-01-06 ENCOUNTER — Other Ambulatory Visit: Payer: Self-pay | Admitting: General Surgery

## 2019-01-06 DIAGNOSIS — Z170 Estrogen receptor positive status [ER+]: Secondary | ICD-10-CM

## 2019-01-06 DIAGNOSIS — C50411 Malignant neoplasm of upper-outer quadrant of right female breast: Secondary | ICD-10-CM

## 2019-01-06 NOTE — Telephone Encounter (Signed)
Scheduled appt per 7/20 sch message - unable to reach pt . Left message with appt date and time

## 2019-01-08 ENCOUNTER — Other Ambulatory Visit: Payer: Medicare HMO

## 2019-01-08 ENCOUNTER — Ambulatory Visit: Payer: Medicare HMO | Admitting: Cardiology

## 2019-01-11 NOTE — Progress Notes (Signed)
[place]  Telephone:([phone] Fax:([phone]) [phone]     ID: Angelica Roberts DOB: [birth year]  MR#: [id number]  BEM#:[id number]  Patient Care Team: Donald Prose, MD as PCP - General (Family Medicine) Nigel Mormon, MD as Consulting Physician (Cardiology) Izaac Reisig, Virgie Dad, MD as Consulting Physician (Oncology) Jovita Kussmaul, MD as Consulting Physician (General Surgery) OTHER MD:   CHIEF COMPLAINT: Invasive ductal carcinoma, estrogen receptor positive  CURRENT TREATMENT: Definitive surgery pending   INTERVAL HISTORY: Angelica Roberts returns today for followup of her noninvasive breast cancer.   Since her last visit, she underwent bilateral diagnostic mammography with tomography and right breast ultrasound at [facility] on 11/20/2017 showing: breast density category C; probable fibrocystic change at 9:30 o'clock of the right breast, six month follow up recommended.  She underwent repeat bilateral diagnostic mammography with tomography and right breast ultrasound at [facility] on 05/26/2018 showing: breast density category C; slight interval decrease in size of cluster of cysts right breast 9:30 o'clock.  She repeated the bilateral diagnostic mammography with tomography and right breast ultrasound at [facility] on 11/25/2018 showing: breast density category B; suspicious 4 mm mass in the right breast at 9 o'clock; the probably-benign right breast mass at 9:30 is stable to slightly smaller; no evidence of right axillary lymphadenopathy; no evidence of malignancy in the left breast.  Accordingly on 12/15/2018 she proceeded to biopsy of the right breast area in question. The pathology from this procedure ([id number]) showed:  1. Right Breast, 9:30  - fibrocystic changes, no malignancy 2. Right Breast, 9 o'clock  - invasive ductal carcinoma, grade 1-2. Prognostic indicators significant for: estrogen receptor, 100% positive and progesterone receptor, 10% positive,  both with strong staining intensity. Proliferation marker Ki67 at 10%. HER2 negative by immunohistochemistry (1+).  Her case was presented at the multidisciplinary breast cancer conference 12/24/2018.  At that time the preliminary plan was suggested: Breast conserving surgery with no sentinel lymph node sampling, antiestrogens, and if the patient receives antiestrogens then no adjuvant radiation.  She is scheduled for right breast lumpectomy on 01/27/2019 under Dr. Marlou Starks.  Quite aside from the breast issues just enumerated, she also underwent chest CT on 02/10/2018, which showed several small pulmonary nodules scattered throughout the lung bilaterally, the majority of which are subpleural in location and favored to be benign.  She presented to the ED on 08/26/2018 with left lower quadrant abdominal pain. Abdomen/pelvis CT performed that day showed possibility of small bowel obstruction. She was also treated for shingles at that visit.  The possible SBO resolved without need for surgical intervention.  REVIEW OF SYSTEMS: Angelica Roberts reports her husband passed in February 2020. She states he made it to his 90th birthday, and 3 of 6 children, all 6 grandchildren, and all 6 great-grandchildren were there to celebrate. A detailed review of systems was otherwise entirely negative.   BREAST CANCER HISTORY: From Dr. Bernell List Khan's original intake note 09/30/2013:  "Angelica Roberts is a 85 y.o. female. Who is seen in the multidisciplinary breast clinic for new diagnosis of DCIS. Patient underwent a screening mammogram that showed suspicious calcifications in the upper-outer quadrant of the left breast. She had a biopsy performed that showed high grade ductal carcinoma in situ with associated necrosis and calcifications. MRI of the breasts showed biopsy changes in the upper outer quadrant of the left breast. No lymph nodes no evidence of any suspicious lesions on the right. Her case was discussed in the  multidisciplinary breast  conference. She is now seen in the [facility] clinic for discussion of treatment options."  Her subsequent history is as detailed below   PAST MEDICAL HISTORY: Past Medical History:  Diagnosis Date   Anemia    Aortic atherosclerosis ([place]) 09/22/2018   Arthritis    osteoarthritis   Bladder disorder June 2012   Sling   Bone spur of toe    right big toe   Cancer ([place]) 02/2004   Esophageal , Skin cancer - basal cell   Carotid artery occlusion    B[place]   Constipation    Coronary atherosclerosis 09/22/2018   DCIS (ductal carcinoma in situ) of breast    left   Gait abnormality    GERD (gastroesophageal reflux disease)    Glaucoma    Hearing impaired    History of hiatal hernia    History of kidney stones    Incontinence of bowel    Kidney stone    Osteoporosis    Personal history of radiation therapy    Postmenopausal HRT (hormone replacement therapy)    Radiation 11/12/13-12/03/13   left breast 42.72 gray   Shingles    Wears partial dentures    partial - lower    PAST SURGICAL HISTORY: Past Surgical History:  Procedure Laterality Date   BREAST BIOPSY Left 09/21/2013   BREAST LUMPECTOMY Left 10/09/2013   BREAST LUMPECTOMY WITH NEEDLE LOCALIZATION Left 10/09/2013   Procedure: BREAST LUMPECTOMY WITH NEEDLE LOCALIZATION;  Surgeon: Merrie Roof, MD;  Location: [facility];  Service: General;  Laterality: Left;   [place] Bladder   COLONOSCOPY  2012   per patient   dental     dental implants   esopheageal cancer  02/2004   Removed    EXTRACORPOREAL SHOCK WAVE LITHOTRIPSY Right 02/25/2017   Procedure: RIGHT EXTRACORPOREAL SHOCK WAVE LITHOTRIPSY (ESWL);  Surgeon: Kathie Rhodes, MD;  Location: [facility];  Service: Urology;  Laterality: Right;   EYE SURGERY Bilateral 2017   cataracts   FOOT SURGERY Right July 2013   Bone spur   HERNIA REPAIR  1982   corrective surgery   INCONTINENCE SURGERY   june 2012   LITHOTRIPSY     VAGINAL HYSTERECTOMY  1977    FAMILY HISTORY Family History  Problem Relation Age of Onset   Hernia Mother        Hiatal   Diabetes Mother    Heart attack Mother 6   Stroke Mother 49   Hypertension Father    Stroke Father 81   Diabetes Father    Hyperlipidemia Father    Stroke Sister    Breast cancer Sister 59       TAH/BSO @ 25; currently 57   Colon cancer Brother 61       lung cancer @ 75; currently 52   Heart attack Maternal Grandmother    Hernia Maternal Grandmother    Stroke Paternal Grandmother 59   Stroke Paternal Grandfather 10   Cancer Brother        Esophageal in 77s; deceased at 29; non-smoker   Heart attack Brother    Cancer Maternal Grandfather        GI cancer in 54s; deceased 60s   Breast cancer Maternal Aunt        dx 85s; deceased late 4s   Cancer Paternal Uncle        2 pat uncles with GI cancer in late 70s/80   Cancer Cousin  2 pat cousins with GI cancer <50yo (sons of uncle with GI cancer)   Diabetes Sister    Heart attack Brother    the patient's father died at the age of 56, in his sleep, with a history of heart disease. The patient's mother died at the age 60. She has had a stroke and heart attack at the age of 91. Both her parents had diabetes. The patient has 3 brothers. One had esophageal cancer but died from a heart attack. A second brother is currently 8. He also has had a heart attack in the past. The third brother, 5, has a history of rheumatoid arthritis and has had both GI and lung cancers. The patient has 2 sisters. One had breast cancer diagnosed at age 89. The other one is 48.   GYNECOLOGIC HISTORY:  No LMP recorded. Patient has had a hysterectomy. Menarche age 26, first live birth age 8, the patient is [employer] P[employer]. She underwent hysterectomy but she does not know whether with or without salpingo-oophorectomy at age 89. She did take hormone replacement until 2003/08/20 when she developed  her esophageal cancer.   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 Her second husband passed away in 08/19/2018. She has 3 children from prior marriages: Karren Burly who lives in [place] and is in business administration, Kathlen Mody who lives in [place] and is says Economist; and Rossy Virag who lives in [place] and is self-employed. The patient has 6 grandchildren and 6 great-grandchildren. She attends the [employer]: not on file, documents given 12/22/2013.   HEALTH MAINTENANCE: Social History   Tobacco Use   Smoking status: Never Smoker   Smokeless tobacco: Never Used  Substance Use Topics   Alcohol use: No    Alcohol/week: 0.0 standard drinks   Drug use: No     Colonoscopy:  PAP:  Bone density:  Lipid panel:  Allergies  Allergen Reactions   Shellfish Allergy Diarrhea, Nausea Only, Swelling and Rash   Iodine Rash    Topical iodine    Current Outpatient Medications  Medication Sig Dispense Refill   acetaminophen (TYLENOL) 500 MG tablet Take 1,000 mg by mouth 4 (four) times daily.      aspirin EC 81 MG tablet Take 81 mg by mouth daily.     b complex vitamins tablet Take 1 tablet by mouth daily.     Cholecalciferol (VITAMIN D3) 125 MCG (5000 UT) CAPS Take 5,000 Units by mouth daily.     ciprofloxacin (CIPRO) 500 MG/5ML (10%) suspension Take 500 mg by mouth every 12 (twelve) hours.     denosumab (PROLIA) 60 MG/ML SOLN injection Inject 60 mg into the skin every 6 (six) months. Administer in upper arm, thigh, or abdomen     esomeprazole (NEXIUM) 40 MG capsule Take 40 mg by mouth daily before breakfast.      famotidine-calcium carbonate-magnesium hydroxide (PEPCID COMPLETE) 10-800-165 MG chewable tablet Chew 1 tablet by mouth daily.     gabapentin (NEURONTIN) 100 MG capsule Take 100 mg by mouth 3 (three) times daily.     ibuprofen  (ADVIL,MOTRIN) 200 MG tablet Take 200 mg by mouth at bedtime.     latanoprost (XALATAN) 0.005 % ophthalmic solution Place 1 drop into both eyes at bedtime.     loratadine (CLARITIN) 10 MG tablet Take 10 mg by mouth daily.     Multiple Vitamins-Minerals (HAIR/SKIN/NAILS) CAPS Take  1 capsule by mouth daily.     rosuvastatin (CRESTOR) 20 MG tablet Take 1 tablet (20 mg total) by mouth daily. 60 tablet 3   No current facility-administered medications for this visit.     OBJECTIVE: Elderly white woman in no acute distress Vitals:   01/12/19 0820  BP: (!) 118/50  Pulse: 63  Resp: 18  Temp: 98.3 F (36.8 C)  SpO2: 100%     Body mass index is 22.53 kg/m.    ECOG FS:0 - Asymptomatic  Sclerae unicteric, pupils round and equal Wearing a mask No cervical or supraclavicular adenopathy Lungs no rales or rhonchi Heart regular rate and rhythm Abd soft, nontender, positive bowel sounds MSK no focal spinal tenderness, no upper extremity lymphedema Neuro: nonfocal, well oriented, appropriate affect Breasts: The right breast is status post recent biopsy.  There is a minimal ecchymosis.  There is no palpable mass.  The left breast is status post remote lumpectomy and radiation.  There is no evidence of local recurrence.  Both axillae are benign.  LAB RESULTS:  CMP     Component Value Date/Time   NA 141 08/28/2018 0239   NA 144 09/30/2013 1159   K 4.1 08/28/2018 0239   K 3.7 09/30/2013 1159   CL 111 08/28/2018 0239   CO2 23 08/28/2018 0239   CO2 27 09/30/2013 1159   GLUCOSE 113 (H) 08/28/2018 0239   GLUCOSE 105 09/30/2013 1159   BUN 7 (L) 08/28/2018 0239   BUN 15.0 09/30/2013 1159   CREATININE 0.73 08/28/2018 0239   CREATININE 0.98 02/10/2018 0745   CREATININE 0.9 09/30/2013 1159   CALCIUM 8.4 (L) 08/28/2018 0239   CALCIUM 9.8 09/30/2013 1159   PROT 6.1 (L) 08/27/2018 0326   PROT 7.5 09/30/2013 1159   ALBUMIN 3.2 (L) 08/27/2018 0326   ALBUMIN 3.8 09/30/2013 1159   AST 31  08/27/2018 0326   AST 18 02/10/2018 0745   AST 21 09/30/2013 1159   ALT 22 08/27/2018 0326   ALT 13 02/10/2018 0745   ALT 15 09/30/2013 1159   ALKPHOS 60 08/27/2018 0326   ALKPHOS 74 09/30/2013 1159   BILITOT 0.6 08/27/2018 0326   BILITOT 0.8 02/10/2018 0745   BILITOT 0.81 09/30/2013 1159   GFRNONAA >60 08/28/2018 0239   GFRNONAA 53 (L) 02/10/2018 0745   GFRAA >60 08/28/2018 0239   GFRAA >60 02/10/2018 0745    I No results found for: SPEP  Lab Results  Component Value Date   WBC 6.7 08/27/2018   NEUTROABS 4.8 08/27/2018   HGB 11.2 (L) 08/27/2018   HCT 34.8 (L) 08/27/2018   MCV 91.8 08/27/2018   PLT 205 08/27/2018      Chemistry      Component Value Date/Time   NA 141 08/28/2018 0239   NA 144 09/30/2013 1159   K 4.1 08/28/2018 0239   K 3.7 09/30/2013 1159   CL 111 08/28/2018 0239   CO2 23 08/28/2018 0239   CO2 27 09/30/2013 1159   BUN 7 (L) 08/28/2018 0239   BUN 15.0 09/30/2013 1159   CREATININE 0.73 08/28/2018 0239   CREATININE 0.98 02/10/2018 0745   CREATININE 0.9 09/30/2013 1159      Component Value Date/Time   CALCIUM 8.4 (L) 08/28/2018 0239   CALCIUM 9.8 09/30/2013 1159   ALKPHOS 60 08/27/2018 0326   ALKPHOS 74 09/30/2013 1159   AST 31 08/27/2018 0326   AST 18 02/10/2018 0745   AST 21 09/30/2013 1159   ALT 22 08/27/2018 0326  ALT 13 02/10/2018 0745   ALT 15 09/30/2013 1159   BILITOT 0.6 08/27/2018 0326   BILITOT 0.8 02/10/2018 0745   BILITOT 0.81 09/30/2013 1159       No results found for: LABCA2  No components found for: LABCA125  No results for input(s): INR in the last 168 hours.  Urinalysis    Component Value Date/Time   COLORURINE YELLOW 08/26/2018 2013    STUDIES: Mm Clip Placement Right  Result Date: 12/15/2018 CLINICAL DATA:  Patient status post ultrasound-guided biopsy 2 right breast masses. EXAM: DIAGNOSTIC RIGHT MAMMOGRAM POST ULTRASOUND BIOPSY COMPARISON:  Previous exam(s). FINDINGS: Mammographic images were obtained  following ultrasound guided biopsy of 2 right breast masses. Site 1: 9 o'clock position 6 cm from nipple: Heart shaped clip: In appropriate position. Site 2: 9:30 o'clock position 5 cm from nipple: Coil shaped clip: In appropriate position. IMPRESSION: Appropriate position biopsy marking clips status post ultrasound-guided biopsy 2 right breast masses. Final Assessment: Post Procedure Mammograms for Marker Placement Electronically Signed   By: Lovey Newcomer M.D.   On: 12/15/2018 13:49   [place] Rt Breast Bx W Loc Dev 1st Lesion Img Bx Spec US Guide  Addendum Date: 12/17/2018   ADDENDUM REPORT: 12/16/2018 15:39 ADDENDUM: Pathology revealed GRADE I/II INVASIVE DUCTAL CARCINOMA of the Right breast, 9 o'clock, 6 cm fn. This was found to be concordant by Dr. Lovey Newcomer. Pathology revealed FIBROCYSTIC CHANGES of the Right breast, 9:30 o'clock, 5 cm fn. This was found to be concordant by Dr. Lovey Newcomer. Pathology results were discussed with the patient by telephone. The patient reported doing well after the biopsies with tenderness at the sites. Post biopsy instructions and care were reviewed and questions were answered. The patient was encouraged to call [facility] for any additional concerns. Surgical consultation has been arranged with Dr. Autumn Messing, per patient request, at [facility] on December 30, 2018. Pathology results reported by Terie Purser, RN on 12/16/2018. Electronically Signed   By: Lovey Newcomer M.D.   On: 12/16/2018 15:39   Result Date: 12/17/2018 CLINICAL DATA:  Patient with 2 masses right breast, for ultrasound-guided core needle biopsy. EXAM: ULTRASOUND GUIDED RIGHT BREAST CORE NEEDLE BIOPSY COMPARISON:  Previous exam(s). FINDINGS: I met with the patient and we discussed the procedure of ultrasound-guided biopsy, including benefits and alternatives. We discussed the high likelihood of a successful procedure. We discussed the risks of the procedure, including infection,  bleeding, tissue injury, clip migration, and inadequate sampling. Informed written consent was given. The usual time-out protocol was performed immediately prior to the procedure. Site 1: 9 o'clock position 6 cm from nipple: Heart shaped clip. Lesion quadrant: Upper outer quadrant Using sterile technique and 1% Lidocaine as local anesthetic, under direct ultrasound visualization, a 12 gauge spring-loaded device was used to perform biopsy of right breast mass 9 o'clock position using a lateral approach. At the conclusion of the procedure a heart shaped tissue marker clip was deployed into the biopsy cavity. Follow up 2 view mammogram was performed and dictated separately. Site 2: 9:30 o'clock position 5 cm from nipple: Coil shaped clip. Lesion quadrant: Upper outer quadrant Using sterile technique and 1% Lidocaine as local anesthetic, under direct ultrasound visualization, a 14 gauge spring-loaded device was used to perform biopsy of right breast mass 9:30 o'clock using a lateral approach. At the conclusion of the procedure a coil shaped tissue marker clip was deployed into the biopsy cavity. Follow up 2 view mammogram was performed  and dictated separately. IMPRESSION: Ultrasound guided biopsy of 2 right breast masses as above. No apparent complications. Electronically Signed: By: Lovey Newcomer M.D. On: 12/15/2018 13:48   [place] Rt Breast Bx W Loc Dev Ea Add Lesion Img Bx Spec US Guide  Addendum Date: 12/17/2018   ADDENDUM REPORT: 12/16/2018 15:39 ADDENDUM: Pathology revealed GRADE I/II INVASIVE DUCTAL CARCINOMA of the Right breast, 9 o'clock, 6 cm fn. This was found to be concordant by Dr. Lovey Newcomer. Pathology revealed FIBROCYSTIC CHANGES of the Right breast, 9:30 o'clock, 5 cm fn. This was found to be concordant by Dr. Lovey Newcomer. Pathology results were discussed with the patient by telephone. The patient reported doing well after the biopsies with tenderness at the sites. Post biopsy instructions and care were  reviewed and questions were answered. The patient was encouraged to call [facility] for any additional concerns. Surgical consultation has been arranged with Dr. Autumn Messing, per patient request, at [facility] on December 30, 2018. Pathology results reported by Terie Purser, RN on 12/16/2018. Electronically Signed   By: Lovey Newcomer M.D.   On: 12/16/2018 15:39   Result Date: 12/17/2018 CLINICAL DATA:  Patient with 2 masses right breast, for ultrasound-guided core needle biopsy. EXAM: ULTRASOUND GUIDED RIGHT BREAST CORE NEEDLE BIOPSY COMPARISON:  Previous exam(s). FINDINGS: I met with the patient and we discussed the procedure of ultrasound-guided biopsy, including benefits and alternatives. We discussed the high likelihood of a successful procedure. We discussed the risks of the procedure, including infection, bleeding, tissue injury, clip migration, and inadequate sampling. Informed written consent was given. The usual time-out protocol was performed immediately prior to the procedure. Site 1: 9 o'clock position 6 cm from nipple: Heart shaped clip. Lesion quadrant: Upper outer quadrant Using sterile technique and 1% Lidocaine as local anesthetic, under direct ultrasound visualization, a 12 gauge spring-loaded device was used to perform biopsy of right breast mass 9 o'clock position using a lateral approach. At the conclusion of the procedure a heart shaped tissue marker clip was deployed into the biopsy cavity. Follow up 2 view mammogram was performed and dictated separately. Site 2: 9:30 o'clock position 5 cm from nipple: Coil shaped clip. Lesion quadrant: Upper outer quadrant Using sterile technique and 1% Lidocaine as local anesthetic, under direct ultrasound visualization, a 14 gauge spring-loaded device was used to perform biopsy of right breast mass 9:30 o'clock using a lateral approach. At the conclusion of the procedure a coil shaped tissue marker clip was deployed into  the biopsy cavity. Follow up 2 view mammogram was performed and dictated separately. IMPRESSION: Ultrasound guided biopsy of 2 right breast masses as above. No apparent complications. Electronically Signed: By: Lovey Newcomer M.D. On: 12/15/2018 13:48    ASSESSMENT: 85 y.o. 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  (1) status post left lumpectomy 10/09/2013 showing only atypical lobular hyperplasia, no residual DCIS ([id number])  (2) adjuvant radiation completed 12/03/2013  (3) genetic testing June 2015 was normal and did not reveal a mutation in these genes: ATM, BARD1, BRCA1, BRCA2, BRIP1, CDH1, CHEK2, EPCAM, MLH1, MRE11A, MSH2, MSH6, MUTYH, NBN, NF1, PALB2, PMS2, PTEN, RAD50, RAD51C, RAD51D, SMARCA4, STK11, and TP53  (4) the patient opted against antiestrogen therapy  (5) status post right breast upper outer quadrant biopsy 12/15/2018 for a clinical T1a N0, stage IA invasive ductal carcinoma, grade  1, estrogen and progesterone receptor positive, HER-2 not amplified, with an MIB-1-1 of 10%  (6) breast conserving surgery with no sentinel lymph node sampling scheduled for 01/27/2019  (7) to start anastrozole early September  (a) the patient has a history of mild osteopenia, receives denosumab/Prolia through her primary care physician  (8) if tolerates anastrozole well, no radiation planned  (9) remote history of esophageal cancer status post surgery at [facility] 2005    PLAN: I spent approximately 30 minutes face to face with [facility] with more than 50% of that time spent in counseling and coordination of care.  We reviewed her pathology report and I gave her a copy.  She understands that she seems to have a very early breast cancer which is invasive, but slow-growing.  It is estrogen receptor positive, which means a "eat"  estrogen, but it is HER-2 negative.  We reviewed our discussion of her case in the multidisciplinary breast cancer conference and our recommendation that she undergo lumpectomy without sentinel lymph node sampling and then start antiestrogens.  If she tolerates antiestrogens she would not need adjuvant radiation  We discussed anastrozole in detail and she has a good understanding of the possible toxicities side effects and complications of this medication.  The main concern of course is osteoporosis and she is receiving denosumab/Prolia through Dr. Lynnda Child office  Emalia is very much in agreement with this plan.  Note that she is planning to move to the [place] area to live closer to 1 of her daughters.  We left it that she will have a phone conference with me late August, where we will review the final post surgical results.  If all is as expected she would start anastrozole at that time.  We will arrange for medical oncology follow-up with her in the [place] area for late October  I do not anticipate her needing radiation  She knows to call for any other issue that may develop before the next visit here.   Chauncey Cruel, MD   01/12/2019 9:06 AM  Oncology and Hematology [place] Tel. [phone]  FAX [phone]   I, Wilburn Mylar, am acting as scribe for Dr. Virgie Dad. Caroleena Paolini.  I, Lurline Del MD, have reviewed the above documentation for accuracy and completeness, and I agree with the above.

## 2019-01-12 ENCOUNTER — Inpatient Hospital Stay: Payer: Medicare HMO | Attending: Oncology | Admitting: Oncology

## 2019-01-12 ENCOUNTER — Telehealth: Payer: Self-pay | Admitting: Oncology

## 2019-01-12 ENCOUNTER — Other Ambulatory Visit: Payer: Self-pay

## 2019-01-12 VITALS — BP 118/50 | HR 63 | Temp 98.3°F | Resp 18 | Ht 62.0 in | Wt 123.2 lb

## 2019-01-12 DIAGNOSIS — R918 Other nonspecific abnormal finding of lung field: Secondary | ICD-10-CM | POA: Insufficient documentation

## 2019-01-12 DIAGNOSIS — Z87442 Personal history of urinary calculi: Secondary | ICD-10-CM | POA: Insufficient documentation

## 2019-01-12 DIAGNOSIS — Z85828 Personal history of other malignant neoplasm of skin: Secondary | ICD-10-CM | POA: Diagnosis not present

## 2019-01-12 DIAGNOSIS — Z8501 Personal history of malignant neoplasm of esophagus: Secondary | ICD-10-CM | POA: Diagnosis not present

## 2019-01-12 DIAGNOSIS — I700 Atherosclerosis of aorta: Secondary | ICD-10-CM

## 2019-01-12 DIAGNOSIS — C50412 Malignant neoplasm of upper-outer quadrant of left female breast: Secondary | ICD-10-CM

## 2019-01-12 DIAGNOSIS — D649 Anemia, unspecified: Secondary | ICD-10-CM

## 2019-01-12 DIAGNOSIS — K5900 Constipation, unspecified: Secondary | ICD-10-CM

## 2019-01-12 DIAGNOSIS — M810 Age-related osteoporosis without current pathological fracture: Secondary | ICD-10-CM | POA: Diagnosis not present

## 2019-01-12 DIAGNOSIS — D0510 Intraductal carcinoma in situ of unspecified breast: Secondary | ICD-10-CM

## 2019-01-12 DIAGNOSIS — Z7982 Long term (current) use of aspirin: Secondary | ICD-10-CM

## 2019-01-12 DIAGNOSIS — C50411 Malignant neoplasm of upper-outer quadrant of right female breast: Secondary | ICD-10-CM | POA: Insufficient documentation

## 2019-01-12 DIAGNOSIS — Z79899 Other long term (current) drug therapy: Secondary | ICD-10-CM

## 2019-01-12 DIAGNOSIS — Z923 Personal history of irradiation: Secondary | ICD-10-CM | POA: Diagnosis not present

## 2019-01-12 DIAGNOSIS — I2510 Atherosclerotic heart disease of native coronary artery without angina pectoris: Secondary | ICD-10-CM | POA: Insufficient documentation

## 2019-01-12 DIAGNOSIS — K219 Gastro-esophageal reflux disease without esophagitis: Secondary | ICD-10-CM | POA: Diagnosis not present

## 2019-01-12 DIAGNOSIS — Z803 Family history of malignant neoplasm of breast: Secondary | ICD-10-CM | POA: Diagnosis not present

## 2019-01-12 DIAGNOSIS — M1990 Unspecified osteoarthritis, unspecified site: Secondary | ICD-10-CM

## 2019-01-12 DIAGNOSIS — R269 Unspecified abnormalities of gait and mobility: Secondary | ICD-10-CM

## 2019-01-12 DIAGNOSIS — Z170 Estrogen receptor positive status [ER+]: Secondary | ICD-10-CM | POA: Diagnosis not present

## 2019-01-12 NOTE — Telephone Encounter (Signed)
I talk with patient regarding October. Patient had a question about MD seeing her after surgery

## 2019-01-13 ENCOUNTER — Other Ambulatory Visit: Payer: Self-pay | Admitting: Oncology

## 2019-01-13 DIAGNOSIS — M810 Age-related osteoporosis without current pathological fracture: Secondary | ICD-10-CM | POA: Diagnosis not present

## 2019-01-13 DIAGNOSIS — Z6822 Body mass index (BMI) 22.0-22.9, adult: Secondary | ICD-10-CM | POA: Diagnosis not present

## 2019-01-13 DIAGNOSIS — C50919 Malignant neoplasm of unspecified site of unspecified female breast: Secondary | ICD-10-CM | POA: Diagnosis not present

## 2019-01-13 DIAGNOSIS — E7800 Pure hypercholesterolemia, unspecified: Secondary | ICD-10-CM | POA: Diagnosis not present

## 2019-01-13 DIAGNOSIS — C159 Malignant neoplasm of esophagus, unspecified: Secondary | ICD-10-CM | POA: Diagnosis not present

## 2019-01-13 DIAGNOSIS — E559 Vitamin D deficiency, unspecified: Secondary | ICD-10-CM | POA: Diagnosis not present

## 2019-01-13 DIAGNOSIS — H409 Unspecified glaucoma: Secondary | ICD-10-CM | POA: Diagnosis not present

## 2019-01-19 ENCOUNTER — Encounter (HOSPITAL_BASED_OUTPATIENT_CLINIC_OR_DEPARTMENT_OTHER): Payer: Self-pay | Admitting: *Deleted

## 2019-01-19 ENCOUNTER — Other Ambulatory Visit: Payer: Self-pay

## 2019-01-23 ENCOUNTER — Other Ambulatory Visit (HOSPITAL_COMMUNITY)
Admission: RE | Admit: 2019-01-23 | Discharge: 2019-01-23 | Disposition: A | Discharge status: Home / Self Care | Payer: Medicare HMO | Source: Ambulatory Visit | Attending: General Surgery | Admitting: General Surgery

## 2019-01-23 NOTE — Progress Notes (Signed)

## 2019-01-26 ENCOUNTER — Ambulatory Visit
Admission: RE | Admit: 2019-01-26 | Discharge: 2019-01-26 | Disposition: A | Discharge status: Home / Self Care | Payer: Medicare HMO | Source: Ambulatory Visit | Attending: General Surgery | Admitting: General Surgery

## 2019-01-26 ENCOUNTER — Other Ambulatory Visit (HOSPITAL_COMMUNITY)
Admission: RE | Admit: 2019-01-26 | Discharge: 2019-01-26 | Disposition: A | Discharge status: Home / Self Care | Payer: Medicare HMO | Source: Ambulatory Visit | Attending: General Surgery | Admitting: General Surgery

## 2019-01-26 ENCOUNTER — Other Ambulatory Visit: Payer: Self-pay

## 2019-01-26 DIAGNOSIS — C50411 Malignant neoplasm of upper-outer quadrant of right female breast: Secondary | ICD-10-CM

## 2019-01-26 DIAGNOSIS — Z20828 Contact with and (suspected) exposure to other viral communicable diseases: Secondary | ICD-10-CM | POA: Insufficient documentation

## 2019-01-26 DIAGNOSIS — Z170 Estrogen receptor positive status [ER+]: Secondary | ICD-10-CM

## 2019-01-26 DIAGNOSIS — Z01812 Encounter for preprocedural laboratory examination: Secondary | ICD-10-CM | POA: Insufficient documentation

## 2019-01-26 LAB — SARS CORONAVIRUS 2 BY RT PCR (HOSPITAL ORDER, PERFORMED IN ~~LOC~~ HOSPITAL LAB): SARS Coronavirus 2: NEGATIVE

## 2019-01-27 ENCOUNTER — Ambulatory Visit (HOSPITAL_BASED_OUTPATIENT_CLINIC_OR_DEPARTMENT_OTHER)
Admission: RE | Admit: 2019-01-27 | Discharge: 2019-01-27 | Disposition: A | Discharge status: Home / Self Care | Payer: Medicare HMO | Attending: General Surgery | Admitting: General Surgery

## 2019-01-27 ENCOUNTER — Encounter (HOSPITAL_BASED_OUTPATIENT_CLINIC_OR_DEPARTMENT_OTHER): Payer: Self-pay | Admitting: *Deleted

## 2019-01-27 ENCOUNTER — Encounter (HOSPITAL_BASED_OUTPATIENT_CLINIC_OR_DEPARTMENT_OTHER)
Admission: RE | Disposition: A | Discharge status: Home / Self Care | Payer: Self-pay | Source: Home / Self Care | Attending: General Surgery

## 2019-01-27 ENCOUNTER — Ambulatory Visit (HOSPITAL_BASED_OUTPATIENT_CLINIC_OR_DEPARTMENT_OTHER): Payer: Medicare HMO | Admitting: Certified Registered"

## 2019-01-27 ENCOUNTER — Ambulatory Visit
Admission: RE | Admit: 2019-01-27 | Discharge: 2019-01-27 | Disposition: A | Discharge status: Home / Self Care | Payer: Medicare HMO | Source: Ambulatory Visit | Attending: General Surgery | Admitting: General Surgery

## 2019-01-27 DIAGNOSIS — I739 Peripheral vascular disease, unspecified: Secondary | ICD-10-CM | POA: Diagnosis not present

## 2019-01-27 DIAGNOSIS — C50411 Malignant neoplasm of upper-outer quadrant of right female breast: Secondary | ICD-10-CM | POA: Diagnosis not present

## 2019-01-27 DIAGNOSIS — I2510 Atherosclerotic heart disease of native coronary artery without angina pectoris: Secondary | ICD-10-CM | POA: Insufficient documentation

## 2019-01-27 DIAGNOSIS — K219 Gastro-esophageal reflux disease without esophagitis: Secondary | ICD-10-CM | POA: Diagnosis not present

## 2019-01-27 DIAGNOSIS — Z170 Estrogen receptor positive status [ER+]: Secondary | ICD-10-CM | POA: Diagnosis not present

## 2019-01-27 DIAGNOSIS — Z923 Personal history of irradiation: Secondary | ICD-10-CM | POA: Insufficient documentation

## 2019-01-27 DIAGNOSIS — C50911 Malignant neoplasm of unspecified site of right female breast: Secondary | ICD-10-CM | POA: Diagnosis not present

## 2019-01-27 DIAGNOSIS — Z79899 Other long term (current) drug therapy: Secondary | ICD-10-CM | POA: Insufficient documentation

## 2019-01-27 DIAGNOSIS — C50412 Malignant neoplasm of upper-outer quadrant of left female breast: Secondary | ICD-10-CM | POA: Diagnosis not present

## 2019-01-27 HISTORY — PX: BREAST LUMPECTOMY WITH RADIOACTIVE SEED LOCALIZATION: SHX6424

## 2019-01-27 SURGERY — BREAST LUMPECTOMY WITH RADIOACTIVE SEED LOCALIZATION
Anesthesia: General | Site: Breast | Laterality: Right

## 2019-01-27 MED ORDER — ACETAMINOPHEN 500 MG PO TABS
1000.0000 mg | ORAL_TABLET | ORAL | Status: AC
  Administered 2019-01-27: 1000 mg via ORAL

## 2019-01-27 MED ORDER — HYDROCODONE-ACETAMINOPHEN 5-325 MG PO TABS
1.0000 | ORAL_TABLET | Freq: Once | ORAL | Status: AC
  Administered 2019-01-27: 1 via ORAL

## 2019-01-27 MED ORDER — GABAPENTIN 300 MG PO CAPS
300.0000 mg | ORAL_CAPSULE | ORAL | Status: AC
  Administered 2019-01-27: 300 mg via ORAL

## 2019-01-27 MED ORDER — FENTANYL CITRATE (PF) 100 MCG/2ML IJ SOLN
50.0000 ug | INTRAMUSCULAR | Status: AC | PRN
  Administered 2019-01-27: 25 ug via INTRAVENOUS
  Administered 2019-01-27: 50 ug via INTRAVENOUS
  Administered 2019-01-27: 25 ug via INTRAVENOUS

## 2019-01-27 MED ORDER — FENTANYL CITRATE (PF) 100 MCG/2ML IJ SOLN: 25.0000 ug | INTRAMUSCULAR | Status: DC | PRN

## 2019-01-27 MED ORDER — BUPIVACAINE HCL (PF) 0.25 % IJ SOLN
INTRAMUSCULAR | Status: AC
  Filled 2019-01-27: qty 30

## 2019-01-27 MED ORDER — CHLORHEXIDINE GLUCONATE CLOTH 2 % EX PADS: 6.0000 | MEDICATED_PAD | Freq: Once | CUTANEOUS | Status: DC

## 2019-01-27 MED ORDER — LACTATED RINGERS IV SOLN
INTRAVENOUS | Status: DC
  Administered 2019-01-27: 13:00:00 via INTRAVENOUS

## 2019-01-27 MED ORDER — CEFAZOLIN SODIUM-DEXTROSE 2-4 GM/100ML-% IV SOLN
2.0000 g | INTRAVENOUS | Status: AC
  Administered 2019-01-27: 2 g via INTRAVENOUS

## 2019-01-27 MED ORDER — FENTANYL CITRATE (PF) 100 MCG/2ML IJ SOLN
INTRAMUSCULAR | Status: AC
  Filled 2019-01-27: qty 2

## 2019-01-27 MED ORDER — SCOPOLAMINE 1 MG/3DAYS TD PT72: 1.0000 | MEDICATED_PATCH | Freq: Once | TRANSDERMAL | Status: DC

## 2019-01-27 MED ORDER — PROPOFOL 10 MG/ML IV BOLUS
INTRAVENOUS | Status: DC | PRN
  Administered 2019-01-27: 100 mg via INTRAVENOUS

## 2019-01-27 MED ORDER — CEFAZOLIN SODIUM-DEXTROSE 2-4 GM/100ML-% IV SOLN
INTRAVENOUS | Status: AC
  Filled 2019-01-27: qty 100

## 2019-01-27 MED ORDER — ACETAMINOPHEN 500 MG PO TABS
ORAL_TABLET | ORAL | Status: AC
  Filled 2019-01-27: qty 1

## 2019-01-27 MED ORDER — GABAPENTIN 300 MG PO CAPS
ORAL_CAPSULE | ORAL | Status: AC
  Filled 2019-01-27: qty 1

## 2019-01-27 MED ORDER — CELECOXIB 200 MG PO CAPS
200.0000 mg | ORAL_CAPSULE | ORAL | Status: AC
  Administered 2019-01-27: 200 mg via ORAL

## 2019-01-27 MED ORDER — PROPOFOL 500 MG/50ML IV EMUL
INTRAVENOUS | Status: DC | PRN
  Administered 2019-01-27: 25 ug/kg/min via INTRAVENOUS

## 2019-01-27 MED ORDER — HYDROCODONE-ACETAMINOPHEN 5-325 MG PO TABS
ORAL_TABLET | ORAL | Status: AC
  Filled 2019-01-27: qty 1

## 2019-01-27 MED ORDER — SODIUM CHLORIDE 0.9 % IV SOLN
INTRAVENOUS | Status: DC | PRN
  Administered 2019-01-27: 20 ug/min via INTRAVENOUS

## 2019-01-27 MED ORDER — CELECOXIB 200 MG PO CAPS
ORAL_CAPSULE | ORAL | Status: AC
  Filled 2019-01-27: qty 1

## 2019-01-27 MED ORDER — ONDANSETRON HCL 4 MG/2ML IJ SOLN: 4.0000 mg | Freq: Once | INTRAMUSCULAR | Status: DC | PRN

## 2019-01-27 MED ORDER — BUPIVACAINE-EPINEPHRINE (PF) 0.25% -1:200000 IJ SOLN
INTRAMUSCULAR | Status: DC | PRN
  Administered 2019-01-27: 10 mL

## 2019-01-27 MED ORDER — DEXAMETHASONE SODIUM PHOSPHATE 4 MG/ML IJ SOLN
INTRAMUSCULAR | Status: DC | PRN
  Administered 2019-01-27: 10 mg via INTRAVENOUS

## 2019-01-27 MED ORDER — LIDOCAINE HCL (CARDIAC) PF 100 MG/5ML IV SOSY
PREFILLED_SYRINGE | INTRAVENOUS | Status: DC | PRN
  Administered 2019-01-27: 60 mg via INTRAVENOUS

## 2019-01-27 MED ORDER — ONDANSETRON HCL 4 MG/2ML IJ SOLN
INTRAMUSCULAR | Status: DC | PRN
  Administered 2019-01-27: 4 mg via INTRAVENOUS

## 2019-01-27 MED ORDER — HYDROCODONE-ACETAMINOPHEN 5-325 MG PO TABS: 1.0000 | ORAL_TABLET | Freq: Four times a day (QID) | ORAL | 0 refills | Status: DC | PRN

## 2019-01-27 MED ORDER — MIDAZOLAM HCL 2 MG/2ML IJ SOLN: 1.0000 mg | INTRAMUSCULAR | Status: DC | PRN

## 2019-01-27 SURGICAL SUPPLY — 44 items
ADH SKN CLS APL DERMABOND .7 (GAUZE/BANDAGES/DRESSINGS) ×1
APL PRP STRL LF DISP 70% ISPRP (MISCELLANEOUS) ×1
APPLIER CLIP 9.375 MED OPEN (MISCELLANEOUS)
APR CLP MED 9.3 20 MLT OPN (MISCELLANEOUS)
BLADE SURG 15 STRL LF DISP TIS (BLADE) ×1 IMPLANT
BLADE SURG 15 STRL SS (BLADE) ×3
CANISTER SUC SOCK COL 7IN (MISCELLANEOUS) ×1 IMPLANT
CANISTER SUCT 1200ML W/VALVE (MISCELLANEOUS) ×1 IMPLANT
CHLORAPREP W/TINT 26 (MISCELLANEOUS) ×3 IMPLANT
CLIP APPLIE 9.375 MED OPEN (MISCELLANEOUS) IMPLANT
COVER BACK TABLE REUSABLE LG (DRAPES) ×3 IMPLANT
COVER MAYO STAND REUSABLE (DRAPES) ×3 IMPLANT
COVER PROBE W GEL 5X96 (DRAPES) ×3 IMPLANT
COVER WAND RF STERILE (DRAPES) IMPLANT
DECANTER SPIKE VIAL GLASS SM (MISCELLANEOUS) IMPLANT
DERMABOND ADVANCED (GAUZE/BANDAGES/DRESSINGS) ×2
DERMABOND ADVANCED .7 DNX12 (GAUZE/BANDAGES/DRESSINGS) ×1 IMPLANT
DRAPE LAPAROSCOPIC ABDOMINAL (DRAPES) ×3 IMPLANT
DRAPE UTILITY XL STRL (DRAPES) ×3 IMPLANT
ELECT COATED BLADE 2.86 ST (ELECTRODE) ×3 IMPLANT
ELECT REM PT RETURN 9FT ADLT (ELECTROSURGICAL) ×3
ELECTRODE REM PT RTRN 9FT ADLT (ELECTROSURGICAL) ×1 IMPLANT
GLOVE BIO SURGEON STRL SZ7.5 (GLOVE) ×6 IMPLANT
GOWN STRL REUS W/ TWL LRG LVL3 (GOWN DISPOSABLE) ×2 IMPLANT
GOWN STRL REUS W/TWL LRG LVL3 (GOWN DISPOSABLE) ×6
ILLUMINATOR WAVEGUIDE N/F (MISCELLANEOUS) IMPLANT
KIT MARKER MARGIN INK (KITS) ×3 IMPLANT
LIGHT WAVEGUIDE WIDE FLAT (MISCELLANEOUS) IMPLANT
NDL HYPO 25X1 1.5 SAFETY (NEEDLE) IMPLANT
NEEDLE HYPO 25X1 1.5 SAFETY (NEEDLE) ×3 IMPLANT
NS IRRIG 1000ML POUR BTL (IV SOLUTION) ×2 IMPLANT
PACK BASIN DAY SURGERY FS (CUSTOM PROCEDURE TRAY) ×3 IMPLANT
PENCIL BUTTON HOLSTER BLD 10FT (ELECTRODE) ×3 IMPLANT
SLEEVE SCD COMPRESS KNEE MED (MISCELLANEOUS) ×3 IMPLANT
SPONGE LAP 18X18 RF (DISPOSABLE) ×3 IMPLANT
SUT MON AB 4-0 PC3 18 (SUTURE) ×2 IMPLANT
SUT SILK 2 0 SH (SUTURE) IMPLANT
SUT VICRYL 3-0 CR8 SH (SUTURE) ×3 IMPLANT
SYR CONTROL 10ML LL (SYRINGE) ×2 IMPLANT
TOWEL GREEN STERILE FF (TOWEL DISPOSABLE) ×3 IMPLANT
TRAY FAXITRON CT DISP (TRAY / TRAY PROCEDURE) ×3 IMPLANT
TUBE CONNECTING 20'X1/4 (TUBING) ×1
TUBE CONNECTING 20X1/4 (TUBING) ×2 IMPLANT
YANKAUER SUCT BULB TIP NO VENT (SUCTIONS) ×2 IMPLANT

## 2019-01-27 NOTE — Anesthesia Postprocedure Evaluation (Signed)
Anesthesia Post Note  Patient: Angelica Roberts  Procedure(s) Performed: RIGHT BREAST LUMPECTOMY WITH RADIOACTIVE SEED LOCALIZATION (Right Breast)     Patient location during evaluation: PACU Anesthesia Type: General Level of consciousness: awake and alert Pain management: pain level controlled Vital Signs Assessment: post-procedure vital signs reviewed and stable Respiratory status: spontaneous breathing, nonlabored ventilation, respiratory function stable and patient connected to nasal cannula oxygen Cardiovascular status: blood pressure returned to baseline and stable Postop Assessment: no apparent nausea or vomiting Anesthetic complications: no    Last Vitals:  Vitals:   01/27/19 1409 01/27/19 1415  BP:  (!) 120/51  Pulse: 61 79  Resp: 16 16  Temp:    SpO2: 100% 100%    Last Pain:  Vitals:   01/27/19 1400  TempSrc:   PainSc: 0-No pain                 Catalina Gravel

## 2019-01-27 NOTE — Op Note (Signed)
01/27/2019  1:53 PM  PATIENT:  Angelica Roberts  85 y.o. female  PRE-OPERATIVE DIAGNOSIS:  RIGHT BREAST CANCER  POST-OPERATIVE DIAGNOSIS:  RIGHT BREAST CANCER  PROCEDURE:  Procedure(s): RIGHT BREAST LUMPECTOMY WITH RADIOACTIVE SEED LOCALIZATION (Right)  SURGEON:  Surgeon(s) and Role:    * Jovita Kussmaul, MD - Primary  PHYSICIAN ASSISTANT:   ASSISTANTS: none   ANESTHESIA:   local and general  EBL:  minimal   BLOOD ADMINISTERED:none  DRAINS: none   LOCAL MEDICATIONS USED:  MARCAINE     SPECIMEN:  Source of Specimen:  right breast tissue  DISPOSITION OF SPECIMEN:  PATHOLOGY  COUNTS:  YES  TOURNIQUET:  * No tourniquets in log *  DICTATION: .Dragon Dictation   After informed consent was obtained the patient was brought to the operating room and placed in the supine position on the operating table.  After adequate induction of general anesthesia the patient's right breast was prepped with ChloraPrep, allowed to dry, and draped in usual sterile manner.  An appropriate timeout was performed.  Previously an I-125 seed was placed in the lower outer quadrant of the right breast to mark an area of invasive breast cancer.  The neoprobe was set to I-125 in the area of radioactivity was readily identified.  The area around this was infiltrated with quarter percent Marcaine.  A curvilinear incision was made along the lower outer inframammary fold with a 15 blade knife.  The incision was carried through the skin and subcutaneous tissue sharply with the electrocautery until the chest wall was reached.  Dissection was then carried superiorly between the breast tissue and the pectoralis muscle until we were well beyond the area of the radioactive seed.  Next a circular portion of breast tissue was excised sharply around the radioactive seed while checking the area of radioactivity frequently.  Once the specimen was removed it was oriented with the appropriate paint colors.  A specimen radiograph was  obtained that showed the clip and seed to be near the center of the specimen.  The specimen was then sent to pathology for further evaluation.  Hemostasis was achieved using the Bovie electrocautery.  The cavity was marked with clips.  The wound was irrigated with saline.  The deep layer of the wound was then closed with layers of interrupted 3-0 Vicryl stitches.  The skin was closed with a running 4-0 Monocryl subcuticular stitch.  Dermabond dressings were applied.  The patient tolerated the procedure well.  At the end of the case all needle sponge and instrument counts were correct.  The patient was then awakened and taken to recovery in stable condition.  PLAN OF CARE: Discharge to home after PACU  PATIENT DISPOSITION:  PACU - hemodynamically stable.   Delay start of Pharmacological VTE agent (>24hrs) due to surgical blood loss or risk of bleeding: not applicable

## 2019-01-27 NOTE — Transfer of Care (Signed)
Immediate Anesthesia Transfer of Care Note  Patient: Angelica Roberts  Procedure(s) Performed: RIGHT BREAST LUMPECTOMY WITH RADIOACTIVE SEED LOCALIZATION (Right Breast)  Patient Location: PACU  Anesthesia Type:General  Level of Consciousness: drowsy and patient cooperative  Airway & Oxygen Therapy: Patient Spontanous Breathing and Patient connected to face mask oxygen  Post-op Assessment: Report given to RN and Post -op Vital signs reviewed and stable  Post vital signs: Reviewed and stable  Last Vitals:  Vitals Value Taken Time  BP 96/42 01/27/19 1356  Temp    Pulse 62 01/27/19 1357  Resp 15 01/27/19 1357  SpO2 100 % 01/27/19 1357  Vitals shown include unvalidated device data.  Last Pain:  Vitals:   01/27/19 1047  TempSrc: Oral  PainSc: 0-No pain         Complications: No apparent anesthesia complications

## 2019-01-27 NOTE — Discharge Instructions (Signed)

## 2019-01-27 NOTE — Anesthesia Procedure Notes (Signed)
Procedure Name: LMA Insertion Date/Time: 01/27/2019 1:07 PM Performed by: Signe Colt, CRNA Pre-anesthesia Checklist: Patient identified, Emergency Drugs available, Suction available and Patient being monitored Patient Re-evaluated:Patient Re-evaluated prior to induction Oxygen Delivery Method: Circle system utilized Preoxygenation: Pre-oxygenation with 100% oxygen Induction Type: IV induction Ventilation: Mask ventilation without difficulty LMA: LMA inserted LMA Size: 4.0 Number of attempts: 1 Airway Equipment and Method: Bite block Placement Confirmation: positive ETCO2 Tube secured with: Tape Dental Injury: Teeth and Oropharynx as per pre-operative assessment

## 2019-01-27 NOTE — H&P (Signed)
Angelica Roberts  Location: [facility] Surgery Patient #: [id number] DOB: August 09, 1933 Married / Language: English / Race: White Female   History of Present Illness  The patient is a 85 year old female who presents for a follow-up for Breast cancer. 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 She was recently found to have a 4 mm mass in the outer aspect of the right breast as well as a stable 3 mm mass in the outer aspect of the right breast. Both were biopsied in the stable mass was benign. The 4 mm mass was a grade 1-2 invasive ductal cancer that was ER and PR positive and HER-2 negative with a Ki-67 of 10%. She denies any breast pain or discharge from the nipple. She does not smoke.   Allergies Shellfish-derived Products  Iodine *CHEMICALS*  Allergies Reconciled   Medication History B Complex (Oral) Active. Biotin (1000MCG Tablet, Oral) Active. Calcium Carbonate (600MG Tablet, Oral) Active. Vitamin D3 (3000UNIT Tablet, Oral) Active. NexIUM (40MG Capsule DR, Oral) Active. Pepcid (20MG Tablet, Oral) Active. Ibuprofen (200MG Tablet, Oral) Active. Latanoprost (0.005% Solution, Ophthalmic) Active. Loratadine (5MG Tablet Chewable, Oral) Active. Multi Vitamin Daily (Oral) Active. Prolia (60MG/ML Solution, Subcutaneous) Active. Medications Reconciled    Review of Systems  General Not Present- Appetite Loss, Chills, Fatigue, Fever, Night Sweats, Weight Gain and Weight Loss. Skin Not Present- Change in Wart/Mole, Dryness, Hives, Jaundice, New Lesions, Non-Healing Wounds, Rash and Ulcer. HEENT Present- Hearing Loss and Wears glasses/contact lenses. Not Present- Earache, Hoarseness, Nose Bleed, Oral Ulcers, Ringing in the Ears, Seasonal Allergies, Sinus Pain, Sore Throat, Visual Disturbances and Yellow  Eyes. Respiratory Not Present- Bloody sputum, Chronic Cough, Difficulty Breathing, Snoring and Wheezing. Breast Not Present- Breast Mass, Breast Pain, Nipple Discharge and Skin Changes. Cardiovascular Not Present- Chest Pain, Difficulty Breathing Lying Down, Leg Cramps, Palpitations, Rapid Heart Rate, Shortness of Breath and Swelling of Extremities. Gastrointestinal Present- Indigestion. Not Present- Abdominal Pain, Bloating, Bloody Stool, Change in Bowel Habits, Chronic diarrhea, Constipation, Difficulty Swallowing, Excessive gas, Gets full quickly at meals, Hemorrhoids, Nausea, Rectal Pain and Vomiting. Female Genitourinary Not Present- Frequency, Nocturia, Painful Urination, Pelvic Pain and Urgency. Musculoskeletal Not Present- Back Pain, Joint Pain, Joint Stiffness, Muscle Pain, Muscle Weakness and Swelling of Extremities. Neurological Not Present- Decreased Memory, Fainting, Headaches, Numbness, Seizures, Tingling, Tremor, Trouble walking and Weakness. Psychiatric Not Present- Anxiety, Bipolar, Change in Sleep Pattern, Depression, Fearful and Frequent crying. Endocrine Present- Hot flashes. Not Present- Cold Intolerance, Excessive Hunger, Hair Changes, Heat Intolerance and New Diabetes. Hematology Not Present- Easy Bruising, Excessive bleeding, Gland problems, HIV and Persistent Infections.  Vitals  Weight: 132.8 lb Height: 62in Body Surface Area: 1.61 m Body Mass Index: 24.29 kg/m  Temp.: 97.51F (Oral)  Pulse: 93 (Regular)  BP: 122/64(Sitting, Left Arm, Standard)       Physical Exam  General Mental Status-Alert. General Appearance-Consistent with stated age. Hydration-Well hydrated. Voice-Normal.  Head and Neck Head-normocephalic, atraumatic with no lesions or palpable masses. Trachea-midline. Thyroid Gland Characteristics - normal size and consistency.  Eye Eyeball - Bilateral-Extraocular movements intact. Sclera/Conjunctiva - Bilateral-No  scleral icterus.  Chest and Lung Exam Chest and lung exam reveals -quiet, even and easy respiratory effort with no use of accessory muscles and on auscultation, normal breath sounds, no adventitious sounds and normal vocal resonance. Inspection Chest Wall - Normal. Back -  normal.  Breast Note: There is no palpable mass in either breast. There is no palpable axillary, supraclavicular, or cervical lymphadenopathy. The left breast incision has healed nicely. There is some slight inversion of the left nipple which is stable. There is no reproducible discharge with palpation today.   Cardiovascular Cardiovascular examination reveals -normal heart sounds, regular rate and rhythm with no murmurs and normal pedal pulses bilaterally.  Abdomen Note: The abdomen is soft and nontender. There is no palpable mass. She does have a well-healed midline incision   Neurologic Neurologic evaluation reveals -alert and oriented x 3 with no impairment of recent or remote memory. Mental Status-Normal.  Musculoskeletal Normal Exam - Left-Upper Extremity Strength Normal and Lower Extremity Strength Normal. Normal Exam - Right-Upper Extremity Strength Normal and Lower Extremity Strength Normal.  Lymphatic Head & Neck  General Head & Neck Lymphatics: Bilateral - Description - Normal. Axillary  General Axillary Region: Bilateral - Description - Normal. Tenderness - Non Tender. Femoral & Inguinal  Generalized Femoral & Inguinal Lymphatics: Bilateral - Description - Normal. Tenderness - Non Tender.    Assessment & Plan   MALIGNANT NEOPLASM OF UPPER-OUTER QUADRANT OF RIGHT BREAST IN FEMALE, ESTROGEN RECEPTOR POSITIVE (C50.411) Impression: The patient appears to have a small stage I cancer in the outer part of the right breast with favorable markers. I have talked to her about the options for treatment and at this point she favors breast conservation. I think this is a very reasonable  way of treating her cancer. Given her age and the low-grade nature of the cancer I think it would also be reasonable to forego a node evaluation and I will run this by her medical oncologist to make sure they agree. She will require radioactive seed localization. I have discussed with her in detail the risks and benefits of the operation as well as some of the technical aspects and she understands and wishes to proceed. We will also refer her to medical and radiation oncology did talk about adjuvant therapy.  Current Plans Referred to Oncology, for evaluation and follow up (Oncology). Urgent.

## 2019-01-27 NOTE — Anesthesia Preprocedure Evaluation (Signed)
Anesthesia Evaluation  Patient identified by MRN, date of birth, ID band Patient awake    Reviewed: Allergy & Precautions, NPO status , Patient's Chart, lab work & pertinent test results  Airway Mallampati: II  TM Distance: >3 FB Neck ROM: Full    Dental  (+) Dental Advisory Given, Partial Upper, Partial Lower, Implants   Pulmonary neg pulmonary ROS,    Pulmonary exam normal breath sounds clear to auscultation       Cardiovascular + CAD and + Peripheral Vascular Disease  Normal cardiovascular exam Rhythm:Regular Rate:Normal     Neuro/Psych negative neurological ROS     GI/Hepatic Neg liver ROS, GERD  Medicated and Controlled,  Endo/Other  negative endocrine ROS  Renal/GU negative Renal ROS     Musculoskeletal  (+) Arthritis ,   Abdominal   Peds  Hematology negative hematology ROS (+)   Anesthesia Other Findings Day of surgery medications reviewed with the patient.  RIGHT BREAST CANCER  Reproductive/Obstetrics                             Anesthesia Physical Anesthesia Plan  ASA: III  Anesthesia Plan: General   Post-op Pain Management:    Induction: Intravenous  PONV Risk Score and Plan: 3 and Dexamethasone, Ondansetron and Treatment may vary due to age or medical condition  Airway Management Planned: LMA  Additional Equipment:   Intra-op Plan:   Post-operative Plan: Extubation in OR  Informed Consent: I have reviewed the patients History and Physical, chart, labs and discussed the procedure including the risks, benefits and alternatives for the proposed anesthesia with the patient or authorized representative who has indicated his/her understanding and acceptance.     Dental advisory given  Plan Discussed with: CRNA  Anesthesia Plan Comments:         Anesthesia Quick Evaluation

## 2019-01-27 NOTE — Interval H&P Note (Signed)
History and Physical Interval Note:  01/27/2019 12:15 PM  Angelica Roberts  has presented today for surgery, with the diagnosis of RIGHT BREAST CANCER.  The various methods of treatment have been discussed with the patient and family. After consideration of risks, benefits and other options for treatment, the patient has consented to  Procedure(s): RIGHT BREAST LUMPECTOMY WITH RADIOACTIVE SEED LOCALIZATION (Right) as a surgical intervention.  The patient's history has been reviewed, patient examined, no change in status, stable for surgery.  I have reviewed the patient's chart and labs.  Questions were answered to the patient's satisfaction.     Autumn Messing III

## 2019-01-28 ENCOUNTER — Encounter (HOSPITAL_BASED_OUTPATIENT_CLINIC_OR_DEPARTMENT_OTHER): Payer: Self-pay | Admitting: General Surgery

## 2019-02-04 ENCOUNTER — Telehealth: Payer: Self-pay | Admitting: Oncology

## 2019-02-04 NOTE — Progress Notes (Signed)
[place]  [phone] [phone]     ID: Angelica Roberts DOB: 02-13-1934  MR#: [id number]  ZHY#:[id number]  Patient Care Team: Donald Prose, MD as PCP - General (Family Medicine) Nigel Mormon, MD as Consulting Physician (Cardiology) Tanishia Lemaster, Virgie Dad, MD as Consulting Physician (Oncology) Jovita Kussmaul, MD as Consulting Physician (General Surgery) OTHER MD:   CHIEF COMPLAINT: Invasive ductal carcinoma, estrogen receptor positive  CURRENT TREATMENT: Definitive surgery pending   I connected with Angelica Roberts on 02/05/19 at  3:30 PM EDT by telephone visit and verified that I am speaking with the correct person using two identifiers.   I discussed the limitations, risks, security and privacy concerns of performing an evaluation and management service by telemedicine and the availability of in-person appointments. I also discussed with the patient that there may be a patient responsible charge related to this service. The patient expressed understanding and agreed to proceed.   Other persons participating in the visit and their role in the encounter: none  Patients location: Home  Providers location: Clinic     INTERVAL HISTORY: Angelica Roberts is contacted today for follow-up and treatment of her noninvasive breast cancer. She was last seen on 01/12/2019.   Since her last visit here, she underwent a right breast lumpectomy on 01/27/2019. The pathology from this procedure showed ([id number]):  Breast, lumpectomy, right w/seed   - invasive ductal carcinoma, 0.9 cm.  Grade 1   - ductal carcinoma in situ.   - margins not involved.   - invasive carcinoma 0.3 cm from anterior and posterior margins.   - previous biopsy site and biopsy clip.  She did remarkably well with the surgery, with very little pain, slight discomfort, no bleeding, no swelling, no redness.  She felt a little down for 2 or 3 days but has completely recovered and is already  walking about a mile every morning.  She is currently staying at her daughter's.  She is very pleased with the care she received and with the final cosmetic result   REVIEW OF SYSTEMS: Angelica Roberts lives about 30 minutes [place].  She will eventually want to be referred to an oncologist closer to home but at this point she wants to maintain her relationship with [place].  Aside from the issues noted above a detailed review of systems today was stable.   PAST MEDICAL HISTORY: Past Medical History:  Diagnosis Date   Anemia    Aortic atherosclerosis ([place]) 09/22/2018   Arthritis    osteoarthritis   Bladder disorder June 2012   Sling   Bone spur of toe    right big toe   Cancer ([place]) 02/2004   Esophageal , Skin cancer - basal cell   Carotid artery occlusion    Bruit   Constipation    Coronary atherosclerosis 09/22/2018   DCIS (ductal carcinoma in situ) of breast    left   Gait abnormality    GERD (gastroesophageal reflux disease)    Glaucoma    Hearing impaired    History of hiatal hernia    History of kidney stones    Incontinence of bowel    Kidney stone    Osteoporosis    Personal history of radiation therapy    Postmenopausal HRT (hormone replacement therapy)    Radiation 11/12/13-12/03/13   left breast 42.72 gray   Shingles    Wears partial dentures    partial - lower    PAST SURGICAL HISTORY: Past Surgical History:  Procedure Laterality Date   BREAST BIOPSY Left 09/21/2013   BREAST LUMPECTOMY Left 10/09/2013   BREAST LUMPECTOMY WITH NEEDLE LOCALIZATION Left 10/09/2013   Procedure: BREAST LUMPECTOMY WITH NEEDLE LOCALIZATION;  Surgeon: Merrie Roof, MD;  Location: [facility];  Service: General;  Laterality: Left;   BREAST LUMPECTOMY WITH RADIOACTIVE SEED LOCALIZATION Right 01/27/2019   Procedure: RIGHT BREAST LUMPECTOMY WITH RADIOACTIVE SEED LOCALIZATION;  Surgeon: Jovita Kussmaul, MD;  Location: [facility];  Service:  General;  Laterality: Right;   [place] Bladder   COLONOSCOPY  2012   per patient   dental     dental implants   esopheageal cancer  02/2004   Removed    EXTRACORPOREAL SHOCK WAVE LITHOTRIPSY Right 02/25/2017   Procedure: RIGHT EXTRACORPOREAL SHOCK WAVE LITHOTRIPSY (ESWL);  Surgeon: Kathie Rhodes, MD;  Location: [facility];  Service: Urology;  Laterality: Right;   EYE SURGERY Bilateral 2017   cataracts   FOOT SURGERY Right July 2013   Bone spur   HERNIA REPAIR  1982   corrective surgery   INCONTINENCE SURGERY  june 2012   LITHOTRIPSY     VAGINAL HYSTERECTOMY  1977    FAMILY HISTORY Family History  Problem Relation Age of Onset   Hernia Mother        Hiatal   Diabetes Mother    Heart attack Mother 68   Stroke Mother 31   Hypertension Father    Stroke Father 78   Diabetes Father    Hyperlipidemia Father    Stroke Sister    Breast cancer Sister 59       TAH/BSO @ 48; currently 68   Colon cancer Brother 85       lung cancer @ 37; currently 97   Heart attack Maternal Grandmother    Hernia Maternal Grandmother    Stroke Paternal Grandmother 60   Stroke Paternal Grandfather 73   Cancer Brother        Esophageal in 81s; deceased at 42; non-smoker   Heart attack Brother    Cancer Maternal Grandfather        GI cancer in 52s; deceased 38s   Breast cancer Maternal Aunt        dx 82s; deceased late 47s   Cancer Paternal Uncle        2 pat uncles with GI cancer in late 40s/80   Cancer Cousin        2 pat cousins with GI cancer <50yo (sons of uncle with GI cancer)   Diabetes Sister    Heart attack Brother    the patient's father died at the age of 82, in his sleep, with a history of heart disease. The patient's mother died at the age 1. She has had a stroke and heart attack at the age of 67. Both her parents had diabetes. The patient has 3 brothers. One had esophageal cancer but died from a heart attack. A second brother is  currently 71. He also has had a heart attack in the past. The third brother, 42, has a history of rheumatoid arthritis and has had both GI and lung cancers. The patient has 2 sisters. One had breast cancer diagnosed at age 79. The other one is 76.   GYNECOLOGIC HISTORY:  No LMP recorded. Patient has had a hysterectomy. Menarche age 33, first live birth age 26, the patient is [place] P3. She underwent hysterectomy but she does not know whether with or  without salpingo-oophorectomy at age 84. She did take hormone replacement until 09-02-03 when she developed her esophageal cancer.   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 Her second husband passed away in 09/01/18. She has 3 children from prior marriages: Karren Burly who lives in [place] and is in business administration, Kathlen Mody who lives in [place] and is says Economist; and Elda Dunkerson who lives in [place] and is self-employed. The patient has 6 grandchildren and 6 great-grandchildren. She attends the [employer]: not on file, documents given 12/22/2013.   HEALTH MAINTENANCE: Social History   Tobacco Use   Smoking status: Never Smoker   Smokeless tobacco: Never Used  Substance Use Topics   Alcohol use: No    Alcohol/week: 0.0 standard drinks   Drug use: No     Colonoscopy:  PAP:  Bone density:  Lipid panel:  Allergies  Allergen Reactions   Shellfish Allergy Diarrhea, Nausea Only, Swelling and Rash   Iodine Rash    Topical iodine    Current Outpatient Medications  Medication Sig Dispense Refill   acetaminophen (TYLENOL) 500 MG tablet Take 1,000 mg by mouth 4 (four) times daily.      anastrozole (ARIMIDEX) 1 MG tablet Take 1 tablet (1 mg total) by mouth daily. 90 tablet 4   aspirin EC 81 MG tablet Take 81 mg by mouth daily.     b complex vitamins tablet Take 1 tablet by mouth daily.      Cholecalciferol (VITAMIN D3) 125 MCG (5000 UT) CAPS Take 5,000 Units by mouth daily.     denosumab (PROLIA) 60 MG/ML SOLN injection Inject 60 mg into the skin every 6 (six) months. Administer in upper arm, thigh, or abdomen     esomeprazole (NEXIUM) 40 MG capsule Take 40 mg by mouth daily before breakfast.      famotidine-calcium carbonate-magnesium hydroxide (PEPCID COMPLETE) 10-800-165 MG chewable tablet Chew 1 tablet by mouth daily.     HYDROcodone-acetaminophen (NORCO/VICODIN) 5-325 MG tablet Take 1-2 tablets by mouth every 6 (six) hours as needed for moderate pain or severe pain. 15 tablet 0   ibuprofen (ADVIL,MOTRIN) 200 MG tablet Take 200 mg by mouth at bedtime.     latanoprost (XALATAN) 0.005 % ophthalmic solution Place 1 drop into both eyes at bedtime.     Multiple Vitamins-Minerals (HAIR/SKIN/NAILS) CAPS Take 1 capsule by mouth daily.     rosuvastatin (CRESTOR) 20 MG tablet Take 1 tablet (20 mg total) by mouth daily. 60 tablet 3   No current facility-administered medications for this visit.     OBJECTIVE: Elderly white woman in no obvious distress There were no vitals filed for this visit.  There is no height or weight on file to calculate BMI.     ECOG FS:0 - Asymptomatic  This was a virtual visit  LAB RESULTS:  CMP     Component Value Date/Time   NA 141 08/28/2018 0239   NA 144 09/30/2013 1159   K 4.1 08/28/2018 0239   K 3.7 09/30/2013 1159   CL 111 08/28/2018 0239   CO2 23 08/28/2018 0239   CO2 27 09/30/2013 1159   GLUCOSE 113 (H) 08/28/2018 0239   GLUCOSE 105 09/30/2013 1159   BUN 7 (L) 08/28/2018 0239   BUN 15.0 09/30/2013 1159   CREATININE 0.73 08/28/2018 0239   CREATININE 0.98 02/10/2018 0745   CREATININE 0.9 09/30/2013 1159  CALCIUM 8.4 (L) 08/28/2018 0239   CALCIUM 9.8 09/30/2013 1159   PROT 6.1 (L) 08/27/2018 0326   PROT 7.5 09/30/2013 1159   ALBUMIN 3.2 (L) 08/27/2018 0326   ALBUMIN 3.8 09/30/2013 1159   AST 31 08/27/2018 0326   AST 18  02/10/2018 0745   AST 21 09/30/2013 1159   ALT 22 08/27/2018 0326   ALT 13 02/10/2018 0745   ALT 15 09/30/2013 1159   ALKPHOS 60 08/27/2018 0326   ALKPHOS 74 09/30/2013 1159   BILITOT 0.6 08/27/2018 0326   BILITOT 0.8 02/10/2018 0745   BILITOT 0.81 09/30/2013 1159   GFRNONAA >60 08/28/2018 0239   GFRNONAA 53 (L) 02/10/2018 0745   GFRAA >60 08/28/2018 0239   GFRAA >60 02/10/2018 0745    I No results found for: SPEP  Lab Results  Component Value Date   WBC 6.7 08/27/2018   NEUTROABS 4.8 08/27/2018   HGB 11.2 (L) 08/27/2018   HCT 34.8 (L) 08/27/2018   MCV 91.8 08/27/2018   PLT 205 08/27/2018      Chemistry      Component Value Date/Time   NA 141 08/28/2018 0239   NA 144 09/30/2013 1159   K 4.1 08/28/2018 0239   K 3.7 09/30/2013 1159   CL 111 08/28/2018 0239   CO2 23 08/28/2018 0239   CO2 27 09/30/2013 1159   BUN 7 (L) 08/28/2018 0239   BUN 15.0 09/30/2013 1159   CREATININE 0.73 08/28/2018 0239   CREATININE 0.98 02/10/2018 0745   CREATININE 0.9 09/30/2013 1159      Component Value Date/Time   CALCIUM 8.4 (L) 08/28/2018 0239   CALCIUM 9.8 09/30/2013 1159   ALKPHOS 60 08/27/2018 0326   ALKPHOS 74 09/30/2013 1159   AST 31 08/27/2018 0326   AST 18 02/10/2018 0745   AST 21 09/30/2013 1159   ALT 22 08/27/2018 0326   ALT 13 02/10/2018 0745   ALT 15 09/30/2013 1159   BILITOT 0.6 08/27/2018 0326   BILITOT 0.8 02/10/2018 0745   BILITOT 0.81 09/30/2013 1159       No results found for: LABCA2  No components found for: LABCA125  No results for input(s): INR in the last 168 hours.  Urinalysis    Component Value Date/Time   COLORURINE YELLOW 08/26/2018 2013    STUDIES: Mm Breast Surgical Specimen  Result Date: 01/27/2019 CLINICAL DATA:  Evaluate surgical specimen following lumpectomy for breast cancer. EXAM: SPECIMEN RADIOGRAPH OF THE RIGHT BREAST COMPARISON:  Previous exam(s). FINDINGS: Status post excision of the RIGHT breast. The radioactive seed and  biopsy marker clip are present and completely intact. IMPRESSION: Specimen radiograph of the RIGHT breast. Electronically Signed   By: Margarette Canada M.D.   On: 01/27/2019 13:37   Mm Rt Radioactive Seed Loc Mammo Guide  Result Date: 01/26/2019 CLINICAL DATA:  85 year old with biopsy-proven invasive ductal carcinoma involving the OUTER RIGHT breast at POSTERIOR depth, slight UPPER OUTER QUADRANT, associated with a heart shaped tissue marker clip. (The patient had a concurrent biopsy of the UPPER OUTER QUADRANT of the RIGHT breast associated with a coil shaped tissue marker clip, pathology fibrocystic changes. The patient presents today for radioactive seed localization of the malignancy in anticipation of lumpectomy which is scheduled for tomorrow. The patient had a negative COVID-19 test prior to today's procedure. EXAM: MAMMOGRAPHIC GUIDED RADIOACTIVE SEED LOCALIZATION OF THE RIGHT BREAST COMPARISON:  Previous exam(s). FINDINGS: Patient presents for radioactive seed localization prior to RIGHT breast lumpectomy. I met with the patient and  we discussed the procedure of seed localization including benefits and alternatives. We discussed the high likelihood of a successful procedure. We discussed the risks of the procedure including infection, bleeding, tissue injury and further surgery. We discussed the low dose of radioactivity involved in the procedure. Informed, written consent was given. The usual time-out protocol was performed immediately prior to the procedure. Using mammographic guidance, sterile technique, 1% lidocaine as local anesthetic, an I-125 radioactive seed was used to localize the heart shaped tissue marker clip associated with the biopsy-proven invasive ductal carcinoma involving the slight UPPER OUTER QUADRANT of the RIGHT breast at POSTERIOR depth using a LATERAL approach. The follow-up mammogram images confirm the seed in the expected location approximately 2 mm POSTERIOR to the tissue marker  clip. The images are marked for Dr. Marlou Starks. Follow-up survey of the patient confirms the presence of the radioactive seed. Order number of I-125 seed: [id number] Total activity: 0.243 mCi Reference Date: 01/22/2023 The patient tolerated the procedure well and was released from the [facility]. She was given instructions regarding seed removal. IMPRESSION: Radioactive seed localization of biopsy-proven malignancy involving the RIGHT breast. No apparent complications. Electronically Signed   By: Evangeline Dakin M.D.   On: 01/26/2019 13:36    ASSESSMENT: 85 y.o. 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  (1) status post left lumpectomy 10/09/2013 showing only atypical lobular hyperplasia, no residual DCIS ([id number])  (2) adjuvant radiation completed 12/03/2013  (3) genetic testing June 2015 was normal and did not reveal a mutation in these genes: ATM, BARD1, BRCA1, BRCA2, BRIP1, CDH1, CHEK2, EPCAM, MLH1, MRE11A, MSH2, MSH6, MUTYH, NBN, NF1, PALB2, PMS2, PTEN, RAD50, RAD51C, RAD51D, SMARCA4, STK11, and TP53  (4) the patient opted against antiestrogen therapy  (5) status post right breast upper outer quadrant biopsy 12/15/2018 for a clinical T1a N0, stage IA invasive ductal carcinoma, grade 1, estrogen and progesterone receptor positive, HER-2 not amplified, with an MIB-1-1 of 10%  (6) status post right lumpectomy 01/27/2019 for a pT1b pNX, stage IA invasive ductal carcinoma, grade 1, with negative margins  (7) to start anastrozole 02/27/2019  (a) the patient has a history of mild osteopenia, receives denosumab/Prolia through her primary care physician  (8) if tolerates anastrozole well, no radiation planned  (9) remote history of esophageal cancer status post surgery at [facility] 2005    PLAN: Angelica Roberts did remarkably well with her  surgery and is very pleased with the final results including the final cosmetic result.  She already has a follow-up with Dr. Marlou Starks in the near future and likely she will be referred to rehab at that point.  Today we discussed anastrozole and she has a good understanding of the possible toxicities side effects and complications of this agent.  She will start February 27, 2019.  We will discuss how she is doing in October.  If she tolerates it well she will not need adjuvant radiation and she understands we have good randomized data for that decision  At some point she may wish to be followed closer to home but for now she wants to maintain her relationship.  She knows to call for any other issue that may develop before the next visit.   Revere Maahs, Virgie Dad, MD  02/05/19 4:19 PM Medical Oncology and Hematology [facility] [address] [place], [place] [address] Tel. [phone]  Fax. [phone]  I, Jacqualyn Posey am acting as a Education administrator for Chauncey Cruel, MD.   I, Lurline Del MD, have reviewed the above documentation for accuracy and completeness, and I agree with the above.

## 2019-02-05 ENCOUNTER — Inpatient Hospital Stay: Payer: Medicare HMO | Attending: Oncology | Admitting: Oncology

## 2019-02-05 DIAGNOSIS — D649 Anemia, unspecified: Secondary | ICD-10-CM | POA: Diagnosis not present

## 2019-02-05 DIAGNOSIS — K5900 Constipation, unspecified: Secondary | ICD-10-CM | POA: Insufficient documentation

## 2019-02-05 DIAGNOSIS — Z8501 Personal history of malignant neoplasm of esophagus: Secondary | ICD-10-CM | POA: Diagnosis not present

## 2019-02-05 DIAGNOSIS — Z79811 Long term (current) use of aromatase inhibitors: Secondary | ICD-10-CM | POA: Diagnosis not present

## 2019-02-05 DIAGNOSIS — C50411 Malignant neoplasm of upper-outer quadrant of right female breast: Secondary | ICD-10-CM

## 2019-02-05 DIAGNOSIS — Z79899 Other long term (current) drug therapy: Secondary | ICD-10-CM | POA: Diagnosis not present

## 2019-02-05 DIAGNOSIS — Z923 Personal history of irradiation: Secondary | ICD-10-CM | POA: Diagnosis not present

## 2019-02-05 DIAGNOSIS — M810 Age-related osteoporosis without current pathological fracture: Secondary | ICD-10-CM | POA: Insufficient documentation

## 2019-02-05 DIAGNOSIS — Z170 Estrogen receptor positive status [ER+]: Secondary | ICD-10-CM | POA: Diagnosis not present

## 2019-02-05 DIAGNOSIS — Z85828 Personal history of other malignant neoplasm of skin: Secondary | ICD-10-CM | POA: Diagnosis not present

## 2019-02-05 DIAGNOSIS — Z7982 Long term (current) use of aspirin: Secondary | ICD-10-CM | POA: Diagnosis not present

## 2019-02-05 DIAGNOSIS — M1990 Unspecified osteoarthritis, unspecified site: Secondary | ICD-10-CM | POA: Insufficient documentation

## 2019-02-05 DIAGNOSIS — Z87442 Personal history of urinary calculi: Secondary | ICD-10-CM | POA: Insufficient documentation

## 2019-02-05 DIAGNOSIS — I700 Atherosclerosis of aorta: Secondary | ICD-10-CM | POA: Diagnosis not present

## 2019-02-05 DIAGNOSIS — Z803 Family history of malignant neoplasm of breast: Secondary | ICD-10-CM | POA: Insufficient documentation

## 2019-02-05 DIAGNOSIS — I2510 Atherosclerotic heart disease of native coronary artery without angina pectoris: Secondary | ICD-10-CM | POA: Insufficient documentation

## 2019-02-05 DIAGNOSIS — K219 Gastro-esophageal reflux disease without esophagitis: Secondary | ICD-10-CM | POA: Insufficient documentation

## 2019-02-05 MED ORDER — ANASTROZOLE 1 MG PO TABS: 1.0000 mg | ORAL_TABLET | Freq: Every day | ORAL | 4 refills | Status: DC

## 2019-02-10 DIAGNOSIS — H401132 Primary open-angle glaucoma, bilateral, moderate stage: Secondary | ICD-10-CM | POA: Diagnosis not present

## 2019-02-10 DIAGNOSIS — H35372 Puckering of macula, left eye: Secondary | ICD-10-CM | POA: Diagnosis not present

## 2019-02-10 DIAGNOSIS — H01004 Unspecified blepharitis left upper eyelid: Secondary | ICD-10-CM | POA: Diagnosis not present

## 2019-02-10 DIAGNOSIS — H01002 Unspecified blepharitis right lower eyelid: Secondary | ICD-10-CM | POA: Diagnosis not present

## 2019-02-10 DIAGNOSIS — H01005 Unspecified blepharitis left lower eyelid: Secondary | ICD-10-CM | POA: Diagnosis not present

## 2019-02-10 DIAGNOSIS — Z961 Presence of intraocular lens: Secondary | ICD-10-CM | POA: Diagnosis not present

## 2019-02-10 DIAGNOSIS — H01001 Unspecified blepharitis right upper eyelid: Secondary | ICD-10-CM | POA: Diagnosis not present

## 2019-02-10 DIAGNOSIS — H04123 Dry eye syndrome of bilateral lacrimal glands: Secondary | ICD-10-CM | POA: Diagnosis not present

## 2019-02-12 DIAGNOSIS — Z23 Encounter for immunization: Secondary | ICD-10-CM | POA: Diagnosis not present

## 2019-04-13 ENCOUNTER — Telehealth: Payer: Self-pay | Admitting: Oncology

## 2019-04-13 NOTE — Progress Notes (Signed)
[place]  [phone] [phone]     ID: Angelica Roberts DOB: April 10, 1934  MR#: [id number]  DGU#:[id number]  Patient Care Team: Donald Prose, MD as PCP - General (Family Medicine) Nigel Mormon, MD as Consulting Physician (Cardiology) Ruddy Swire, Virgie Dad, MD as Consulting Physician (Oncology) Jovita Kussmaul, MD as Consulting Physician (General Surgery) OTHER MD:   CHIEF COMPLAINT: Invasive ductal carcinoma, estrogen receptor positive  CURRENT TREATMENT: Anastrozole   I connected with Angelica Roberts on 04/13/19 at  2:00 PM EDT by telephone visit and verified that I am speaking with the correct person using two identifiers.   I discussed the limitations, risks, security and privacy concerns of performing an evaluation and management service by telemedicine and the availability of in-person appointments. I also discussed with the patient that there may be a patient responsible charge related to this service. The patient expressed understanding and agreed to proceed.   Other persons participating in the visit and their role in the encounter: none  Patient's location: Home  Provider's location: Clinic    INTERVAL HISTORY: Angelica Roberts is contacted today for follow-up and treatment of her noninvasive breast cancer.    She was prescribed anastrozole during her last encounter on 02/05/2019.  She started it on her birthday on 02/27/2017.  So far she is tolerating it well, with no hot flashes or arthralgias or myalgias.  She gets a little minimal nausea with it which lasts perhaps 45 minutes to an hour.  She has never vomited.  She feels it will not be a problem for her to take this medication for 5 years.  Since her last visit, she has not undergone any additional studies. She will be due for repeat mammography in 11/2019.   REVIEW OF SYSTEMS: Gordie walks 1 or 2 miles a day.  One of her children has quarantined himself and his wife and they do all the  grocery shopping distantly so that they can continue to see her and visit her safely.  All the other children are not coming by and she only talks to them by phone.  A detailed review of systems today was otherwise stable  LEFT BREAST CANCER HISTORY: From Dr. Dana Allan original intake note 09/30/2013:  "Angelica Roberts is a 85 y.o. female. Who is seen in the multidisciplinary breast clinic for new diagnosis of DCIS. Patient underwent a screening mammogram that showed suspicious calcifications in the upper-outer quadrant of the left breast. She had a biopsy performed that showed high grade ductal carcinoma in situ with associated necrosis and calcifications. MRI of the breasts showed biopsy changes in the upper outer quadrant of the left breast. No lymph nodes no evidence of any suspicious lesions on the right. Her case was discussed in the multidisciplinary breast conference. She is now seen in the [facility] clinic for discussion of treatment options."   RIGHT BREAST CANCER HISTORY: she underwent bilateral diagnostic mammography with tomography and right breast ultrasound at [facility] on 11/20/2017 showing: breast density category C; probable fibrocystic change at 9:30 o'clock of the right breast, six month follow up recommended.  She underwent repeat bilateral diagnostic mammography with tomography and right breast ultrasound at [facility] on 05/26/2018 showing: breast density category C; slight interval decrease in size of cluster of cysts right breast 9:30 o'clock.  She repeated the bilateral diagnostic mammography with tomography and right breast ultrasound at [facility] on 11/25/2018 showing: breast density category B; suspicious 4 mm  mass in the right breast at 9 o'clock; the probably-benign right breast mass at 9:30 is stable to slightly smaller; no evidence of right axillary lymphadenopathy; no evidence of malignancy in the left breast.  Accordingly on 12/15/2018 she  proceeded to biopsy of the right breast area in question. The pathology from this procedure ([id number]) showed:  1. Right Breast, 9:30             - fibrocystic changes, no malignancy 2. Right Breast, 9 o'clock             - invasive ductal carcinoma, grade 1-2. Prognostic indicators significant for: estrogen receptor, 100% positive and progesterone receptor, 10% positive, both with strong staining intensity. Proliferation marker Ki67 at 10%. HER2 negative by immunohistochemistry (1+).  Her subsequent history is as detailed below.   PAST MEDICAL HISTORY: Past Medical History:  Diagnosis Date  . Anemia   . Aortic atherosclerosis ([place]) 09/22/2018  . Arthritis    osteoarthritis  . Bladder disorder June 2012   Sling  . Bone spur of toe    right big toe  . Cancer ([place]) 02/2004   Esophageal , Skin cancer - basal cell  . Carotid artery occlusion    Bruit  . Constipation   . Coronary atherosclerosis 09/22/2018  . 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 Gait abnormality   . GERD (gastroesophageal reflux disease)   . Glaucoma   . Hearing impaired   . History of hiatal hernia   . History of kidney stones   . Incontinence of bowel   . Kidney stone   . Osteoporosis   . Personal history of radiation therapy   . Postmenopausal HRT (hormone replacement therapy)   . Radiation 11/12/13-12/03/13   left breast 42.72 gray  . Shingles   . Wears partial dentures    partial - lower    PAST SURGICAL HISTORY: Past Surgical History:  Procedure Laterality Date  . BREAST BIOPSY Left 09/21/2013  . BREAST LUMPECTOMY Left 10/09/2013  . BREAST LUMPECTOMY WITH NEEDLE LOCALIZATION Left 10/09/2013   Procedure: BREAST LUMPECTOMY WITH NEEDLE LOCALIZATION;  Surgeon: Merrie Roof, MD;  Location: [facility];  Service: General;  Laterality: Left;  . BREAST LUMPECTOMY WITH RADIOACTIVE SEED LOCALIZATION Right 01/27/2019   Procedure: RIGHT BREAST LUMPECTOMY WITH RADIOACTIVE SEED  LOCALIZATION;  Surgeon: Jovita Kussmaul, MD;  Location: [facility];  Service: General;  Laterality: Right;  . CHOLECYSTECTOMY  1995   Gall Bladder  . COLONOSCOPY  2012   per patient  . dental     dental implants  . 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 EXTRACORPOREAL SHOCK WAVE LITHOTRIPSY Right 02/25/2017   Procedure: RIGHT EXTRACORPOREAL SHOCK WAVE LITHOTRIPSY (ESWL);  Surgeon: Kathie Rhodes, MD;  Location: [facility];  Service: Urology;  Laterality: Right;  . EYE SURGERY Bilateral 2017   cataracts  . FOOT SURGERY Right July 2013   Bone spur  . HERNIA REPAIR  1982   corrective surgery  . INCONTINENCE SURGERY  june 2012  . LITHOTRIPSY    . VAGINAL HYSTERECTOMY  1977    FAMILY HISTORY Family History  Problem Relation Age of Onset  . Hernia Mother        Hiatal  . Diabetes Mother   . Heart attack Mother 3  . Stroke Mother 33  . Hypertension Father   . Stroke Father 48  . Diabetes Father   . Hyperlipidemia Father   .  Stroke Sister   . Breast cancer Sister 32       TAH/BSO @ 34; currently 66  . Colon cancer Brother 86       lung cancer @ 55; currently 70  . Heart attack Maternal Grandmother   . Hernia Maternal Grandmother   . Stroke Paternal Grandmother 80  . Stroke Paternal Grandfather 45  . Cancer Brother        Esophageal in 67s; deceased at 30; non-smoker  . Heart attack Brother   . Cancer Maternal Grandfather        GI cancer in 56s; deceased 46s  . Breast cancer Maternal Aunt        dx 30s; deceased late 22s  . Cancer Paternal Uncle        2 pat uncles with GI cancer in late 70s/80  . Cancer Cousin        2 pat cousins with GI cancer <50yo (sons of uncle with GI cancer)  . Diabetes Sister   . Heart attack Brother    the patient's father died at the age of 35, in his sleep, with a history of heart disease. The patient's mother died at the age 38. She has had a stroke and heart attack at the age of 88. Both her parents had diabetes. The patient  has 3 brothers. One had esophageal cancer but died from a heart attack. A second brother is currently 54. He also has had a heart attack in the past. The third brother, 54, has a history of rheumatoid arthritis and has had both GI and lung cancers. The patient has 2 sisters. One had breast cancer diagnosed at age 53. The other one is 55.   GYNECOLOGIC HISTORY:  No LMP recorded. Patient has had a hysterectomy. Menarche age 77, first live birth age 66, the patient is [place] P3. She underwent hysterectomy but she does not know whether with or without salpingo-oophorectomy at age 40. She did take hormone replacement until 19-Aug-2003 when she developed her esophageal cancer.   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 Her second husband passed away in 08/18/18. She has 3 children from prior marriages: Karren Burly who lives in [place] and is in business administration, Kathlen Mody who lives in [place] and is says Economist; and Kennadee Walthour who lives in [place] and is self-employed. The patient has 6 grandchildren and 6 great-grandchildren. She attends the [employer]: not on file, documents given 12/22/2013.   HEALTH MAINTENANCE: Social History   Tobacco Use  . Smoking status: Never Smoker  . Smokeless tobacco: Never Used  Substance Use Topics  . Alcohol use: No    Alcohol/week: 0.0 standard drinks  . Drug use: No     Colonoscopy:  PAP:  Bone density:  Lipid panel:  Allergies  Allergen Reactions  . Shellfish Allergy Diarrhea, Nausea Only, Swelling and Rash  . Iodine Rash    Topical iodine    Current Outpatient Medications  Medication Sig Dispense Refill  . acetaminophen (TYLENOL) 500 MG tablet Take 1,000 mg by mouth 4 (four) times daily.     Marland Kitchen anastrozole (ARIMIDEX) 1 MG tablet Take 1 tablet (1 mg total) by mouth daily. 90 tablet 4  . aspirin EC 81 MG tablet Take 81 mg  by mouth daily.    Marland Kitchen b complex vitamins tablet Take 1 tablet by mouth daily.    Marland Kitchen  Cholecalciferol (VITAMIN D3) 125 MCG (5000 UT) CAPS Take 5,000 Units by mouth daily.    Marland Kitchen denosumab (PROLIA) 60 MG/ML SOLN injection Inject 60 mg into the skin every 6 (six) months. Administer in upper arm, thigh, or abdomen    . esomeprazole (NEXIUM) 40 MG capsule Take 40 mg by mouth daily before breakfast.     . famotidine-calcium carbonate-magnesium hydroxide (PEPCID COMPLETE) 10-800-165 MG chewable tablet Chew 1 tablet by mouth daily.    Marland Kitchen HYDROcodone-acetaminophen (NORCO/VICODIN) 5-325 MG tablet Take 1-2 tablets by mouth every 6 (six) hours as needed for moderate pain or severe pain. 15 tablet 0  . ibuprofen (ADVIL,MOTRIN) 200 MG tablet Take 200 mg by mouth at bedtime.    Marland Kitchen latanoprost (XALATAN) 0.005 % ophthalmic solution Place 1 drop into both eyes at bedtime.    . Multiple Vitamins-Minerals (HAIR/SKIN/NAILS) CAPS Take 1 capsule by mouth daily.    . rosuvastatin (CRESTOR) 20 MG tablet Take 1 tablet (20 mg total) by mouth daily. 60 tablet 3   No current facility-administered medications for this visit.     OBJECTIVE: Elderly white woman  There is no height or weight on file to calculate BMI.     ECOG FS:0 - Asymptomatic  This was a virtual visit  LAB RESULTS:  CMP     Component Value Date/Time   NA 141 08/28/2018 0239   NA 144 09/30/2013 1159   K 4.1 08/28/2018 0239   K 3.7 09/30/2013 1159   CL 111 08/28/2018 0239   CO2 23 08/28/2018 0239   CO2 27 09/30/2013 1159   GLUCOSE 113 (H) 08/28/2018 0239   GLUCOSE 105 09/30/2013 1159   BUN 7 (L) 08/28/2018 0239   BUN 15.0 09/30/2013 1159   CREATININE 0.73 08/28/2018 0239   CREATININE 0.98 02/10/2018 0745   CREATININE 0.9 09/30/2013 1159   CALCIUM 8.4 (L) 08/28/2018 0239   CALCIUM 9.8 09/30/2013 1159   PROT 6.1 (L) 08/27/2018 0326   PROT 7.5 09/30/2013 1159   ALBUMIN 3.2 (L) 08/27/2018 0326   ALBUMIN 3.8 09/30/2013 1159   AST 31 08/27/2018  0326   AST 18 02/10/2018 0745   AST 21 09/30/2013 1159   ALT 22 08/27/2018 0326   ALT 13 02/10/2018 0745   ALT 15 09/30/2013 1159   ALKPHOS 60 08/27/2018 0326   ALKPHOS 74 09/30/2013 1159   BILITOT 0.6 08/27/2018 0326   BILITOT 0.8 02/10/2018 0745   BILITOT 0.81 09/30/2013 1159   GFRNONAA >60 08/28/2018 0239   GFRNONAA 53 (L) 02/10/2018 0745   GFRAA >60 08/28/2018 0239   GFRAA >60 02/10/2018 0745    I No results found for: SPEP  Lab Results  Component Value Date   WBC 6.7 08/27/2018   NEUTROABS 4.8 08/27/2018   HGB 11.2 (L) 08/27/2018   HCT 34.8 (L) 08/27/2018   MCV 91.8 08/27/2018   PLT 205 08/27/2018      Chemistry      Component Value Date/Time   NA 141 08/28/2018 0239   NA 144 09/30/2013 1159   K 4.1 08/28/2018 0239   K 3.7 09/30/2013 1159   CL 111 08/28/2018 0239   CO2 23 08/28/2018 0239   CO2 27 09/30/2013 1159   BUN 7 (L) 08/28/2018 0239   BUN 15.0 09/30/2013 1159   CREATININE 0.73 08/28/2018 0239   CREATININE 0.98 02/10/2018 0745   CREATININE 0.9 09/30/2013 1159      Component Value Date/Time   CALCIUM 8.4 (L) 08/28/2018 0239   CALCIUM 9.8  09/30/2013 1159   ALKPHOS 60 08/27/2018 0326   ALKPHOS 74 09/30/2013 1159   AST 31 08/27/2018 0326   AST 18 02/10/2018 0745   AST 21 09/30/2013 1159   ALT 22 08/27/2018 0326   ALT 13 02/10/2018 0745   ALT 15 09/30/2013 1159   BILITOT 0.6 08/27/2018 0326   BILITOT 0.8 02/10/2018 0745   BILITOT 0.81 09/30/2013 1159       No results found for: LABCA2  No components found for: LABCA125  No results for input(s): INR in the last 168 hours.  Urinalysis    Component Value Date/Time   COLORURINE YELLOW 08/26/2018 2013    STUDIES: No results found.   ASSESSMENT: 85 y.o. BRCA negative Shea Stakes  woman status post left upper outer quadrant biopsy 09/21/2013 ([id number]) for ductal carcinoma in situ, high-grade, estrogen receptor 100% positive, progesterone receptor 15% positive.  (1) status post left  lumpectomy 10/09/2013 showing only atypical lobular hyperplasia, no residual DCIS ([id number])  (2) adjuvant radiation completed 12/03/2013  (3) genetic testing June 2015 was normal and did not reveal a mutation in these genes: ATM, BARD1, BRCA1, BRCA2, BRIP1, CDH1, CHEK2, EPCAM, MLH1, MRE11A, MSH2, MSH6, MUTYH, NBN, NF1, PALB2, PMS2, PTEN, RAD50, RAD51C, RAD51D, SMARCA4, STK11, and TP53  (4) the patient opted against antiestrogen therapy  (5) status post right breast upper outer quadrant biopsy 12/15/2018 for a clinical T1a N0, stage IA invasive ductal carcinoma, grade 1, estrogen and progesterone receptor positive, HER-2 not amplified, with an MIB-1-1 of 10%  (6) status post right lumpectomy 01/27/2019 for a pT1b pNX, stage IA invasive ductal carcinoma, grade 1, with negative margins  (7) started anastrozole 02/27/2019 (her b'day)  (a) the patient has a history of mild osteopenia, receives denosumab/Prolia through her primary care physician  (8) if tolerates anastrozole well, no radiation planned  (9) remote history of esophageal cancer status post surgery at [facility] 2005    PLAN: [facility] is now 4 months out from surgery to her right breast and 5 years out from surgery to her left breast.    She is tolerating anastrozole well so far and if she does not develop any new side effects or problems the plan will be to continue that a total of 5 years.  Of course this can worsen problems with osteopenia.  She is also ready receiving Prolia through her primary care physician.  She will have her next mammogram in June and she will return to see me in July.  She knows to call for any other issue that may develop before that visit.   Shaine Newmark, Virgie Dad, MD  04/13/19 12:44 PM Medical Oncology and Hematology [facility] [place],  [address] Tel. [phone]    Fax. [phone]   I, Wilburn Mylar, am acting as scribe for Dr. Virgie Dad. Ivon Oelkers.  I,  Lurline Del MD, have reviewed the above documentation for accuracy and completeness, and I agree with the above.

## 2019-04-13 NOTE — Telephone Encounter (Signed)
Called patient regarding upcoming Webex appointment, per patient's request this will need to be a phone visit. Patient does not access for this to be a Webex visit.

## 2019-04-14 ENCOUNTER — Inpatient Hospital Stay: Payer: Medicare HMO | Attending: Oncology | Admitting: Oncology

## 2019-04-14 DIAGNOSIS — C50412 Malignant neoplasm of upper-outer quadrant of left female breast: Secondary | ICD-10-CM

## 2019-04-14 DIAGNOSIS — C50411 Malignant neoplasm of upper-outer quadrant of right female breast: Secondary | ICD-10-CM | POA: Diagnosis not present

## 2019-04-14 DIAGNOSIS — Z170 Estrogen receptor positive status [ER+]: Secondary | ICD-10-CM | POA: Diagnosis not present

## 2019-04-14 DIAGNOSIS — D0510 Intraductal carcinoma in situ of unspecified breast: Secondary | ICD-10-CM

## 2019-07-10 ENCOUNTER — Other Ambulatory Visit: Payer: Self-pay | Admitting: Cardiology

## 2019-07-10 DIAGNOSIS — I2510 Atherosclerotic heart disease of native coronary artery without angina pectoris: Secondary | ICD-10-CM

## 2019-08-03 ENCOUNTER — Other Ambulatory Visit: Payer: Self-pay | Admitting: Cardiology

## 2019-08-03 DIAGNOSIS — I2510 Atherosclerotic heart disease of native coronary artery without angina pectoris: Secondary | ICD-10-CM

## 2019-10-27 ENCOUNTER — Other Ambulatory Visit: Payer: Self-pay | Admitting: Cardiology

## 2019-10-27 DIAGNOSIS — I2510 Atherosclerotic heart disease of native coronary artery without angina pectoris: Secondary | ICD-10-CM

## 2019-11-27 ENCOUNTER — Other Ambulatory Visit: Payer: Self-pay

## 2019-11-27 ENCOUNTER — Ambulatory Visit
Admission: RE | Admit: 2019-11-27 | Discharge: 2019-11-27 | Disposition: A | Discharge status: Home / Self Care | Payer: Medicare Other | Source: Ambulatory Visit | Attending: Oncology | Admitting: Oncology

## 2019-11-27 DIAGNOSIS — Z170 Estrogen receptor positive status [ER+]: Secondary | ICD-10-CM

## 2019-11-27 DIAGNOSIS — D0510 Intraductal carcinoma in situ of unspecified breast: Secondary | ICD-10-CM

## 2019-11-27 DIAGNOSIS — C50412 Malignant neoplasm of upper-outer quadrant of left female breast: Secondary | ICD-10-CM

## 2020-01-21 ENCOUNTER — Other Ambulatory Visit: Payer: Self-pay | Admitting: Cardiology

## 2020-01-21 DIAGNOSIS — I2510 Atherosclerotic heart disease of native coronary artery without angina pectoris: Secondary | ICD-10-CM

## 2020-02-08 ENCOUNTER — Other Ambulatory Visit: Payer: Self-pay | Admitting: *Deleted

## 2020-02-08 ENCOUNTER — Encounter: Payer: Self-pay | Admitting: Oncology

## 2020-02-08 MED ORDER — ANASTROZOLE 1 MG PO TABS: 1.0000 mg | ORAL_TABLET | Freq: Every day | ORAL | 4 refills | Status: DC

## 2020-02-12 ENCOUNTER — Other Ambulatory Visit: Payer: Self-pay | Admitting: *Deleted

## 2020-02-12 MED ORDER — ANASTROZOLE 1 MG PO TABS: 1.0000 mg | ORAL_TABLET | Freq: Every day | ORAL | 4 refills | Status: DC

## 2020-05-30 ENCOUNTER — Other Ambulatory Visit: Payer: Self-pay | Admitting: *Deleted

## 2020-05-30 DIAGNOSIS — I6529 Occlusion and stenosis of unspecified carotid artery: Secondary | ICD-10-CM

## 2020-06-07 ENCOUNTER — Encounter: Payer: Self-pay | Admitting: Physician Assistant

## 2020-06-07 ENCOUNTER — Ambulatory Visit (INDEPENDENT_AMBULATORY_CARE_PROVIDER_SITE_OTHER): Payer: Medicare Other | Admitting: Physician Assistant

## 2020-06-07 ENCOUNTER — Other Ambulatory Visit: Payer: Self-pay

## 2020-06-07 ENCOUNTER — Ambulatory Visit (HOSPITAL_COMMUNITY)
Admission: RE | Admit: 2020-06-07 | Discharge: 2020-06-07 | Disposition: A | Discharge status: Home / Self Care | Payer: Medicare Other | Source: Ambulatory Visit | Attending: Physician Assistant | Admitting: Physician Assistant

## 2020-06-07 VITALS — BP 105/58 | HR 74 | Temp 97.1°F | Resp 20 | Ht 62.0 in | Wt 124.1 lb

## 2020-06-07 DIAGNOSIS — I6529 Occlusion and stenosis of unspecified carotid artery: Secondary | ICD-10-CM | POA: Diagnosis not present

## 2020-06-07 NOTE — Progress Notes (Signed)
Office Note     CC:  follow up Requesting Provider:  Donald Prose, MD  HPI: Angelica Roberts is a 86 y.o. ([birth year]) female who presents for routine follow up of carotid stenosis. She was last seen in 2017 at which time she had 1-39% carotid stenosis bilaterally. She has no history of TIA or stroke  She is doing well today. She reports no amaurosis fugax or monocular blindness, no facial drooping, slurred speech, weakness or numbness of upper or lower extremities. She is having some spasms in her right index finger more recently. She is right handed and says she does a lot of typing on her computer. Otherwise no pain or weakness in right hand or arm  She reports that she continues to walk 1 mile a day and otherwise remains very active. She lives alone so she tends to all her house and yard work. She does not have any claudication symptoms, rest pain or non healing wounds. She does have cramping in her legs on the days she does not walk. She states that she more recently has had bouts of kidney stones and her Urologist has been really trying to get her to stay more hydrated so she is working on that  The pt is on a statin for cholesterol management.  The pt is on a daily aspirin.   Other AC: none The pt is not on medication for hypertension.   The pt is not diabetic.   Tobacco hx:  never  Past Medical History:  Diagnosis Date  . Anemia   . Aortic atherosclerosis ([place]) 09/22/2018  . Arthritis    osteoarthritis  . Bladder disorder June 2012   Sling  . Bone spur of toe    right big toe  . Cancer ([place]) 02/2004   Esophageal , Skin cancer - basal cell  . Carotid artery occlusion    Bruit  . Constipation   . Coronary atherosclerosis 09/22/2018  . 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 Gait abnormality   . GERD (gastroesophageal reflux disease)   . Glaucoma   . Hearing impaired   . History of hiatal hernia   . History of kidney stones   . Incontinence of bowel   . Kidney  stone   . Osteoporosis   . Personal history of radiation therapy   . Postmenopausal HRT (hormone replacement therapy)   . Radiation 11/12/13-12/03/13   left breast 42.72 gray  . Shingles   . Wears partial dentures    partial - lower    Past Surgical History:  Procedure Laterality Date  . BREAST BIOPSY Left 09/21/2013  . BREAST LUMPECTOMY Left 10/09/2013  . BREAST LUMPECTOMY Right 2020  . BREAST LUMPECTOMY WITH NEEDLE LOCALIZATION Left 10/09/2013   Procedure: BREAST LUMPECTOMY WITH NEEDLE LOCALIZATION;  Surgeon: Merrie Roof, MD;  Location: [facility];  Service: General;  Laterality: Left;  . BREAST LUMPECTOMY WITH RADIOACTIVE SEED LOCALIZATION Right 01/27/2019   Procedure: RIGHT BREAST LUMPECTOMY WITH RADIOACTIVE SEED LOCALIZATION;  Surgeon: Jovita Kussmaul, MD;  Location: [facility];  Service: General;  Laterality: Right;  . CHOLECYSTECTOMY  1995   Gall Bladder  . COLONOSCOPY  2012   per patient  . dental     dental implants  . 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 EXTRACORPOREAL SHOCK WAVE LITHOTRIPSY Right 02/25/2017   Procedure: RIGHT EXTRACORPOREAL SHOCK WAVE LITHOTRIPSY (ESWL);  Surgeon: Kathie Rhodes, MD;  Location: [facility];  Service: Urology;  Laterality: Right;  . EYE SURGERY Bilateral 2017   cataracts  . FOOT SURGERY Right July 2013   Bone spur  . HERNIA REPAIR  1982   corrective surgery  . INCONTINENCE SURGERY  june 2012  . LITHOTRIPSY    . VAGINAL HYSTERECTOMY  1977    Social History   Socioeconomic History  . Marital status: Widowed    Spouse name: Not on file  . Number of children: 3  . Years of education: Not on file  . Highest education level: Not on file  Occupational History  . Not on file  Tobacco Use  . Smoking status: Never Smoker  . Smokeless tobacco: Never Used  Substance and Sexual Activity  . Alcohol use: No    Alcohol/week: 0.0 standard drinks  . Drug use: No  . Sexual activity: Not Currently  Other  Topics Concern  . Not on file  Social History Narrative  . Not on file   Social Determinants of Health   Financial Resource Strain: Not on file  Food Insecurity: Not on file  Transportation Needs: Not on file  Physical Activity: Not on file  Stress: Not on file  Social Connections: Not on file  Intimate Partner Violence: Not on file    Family History  Problem Relation Age of Onset  . Hernia Mother        Hiatal  . Diabetes Mother   . Heart attack Mother 24  . Stroke Mother 21  . Hypertension Father   . Stroke Father 58  . Diabetes Father   . Hyperlipidemia Father   . Stroke Sister   . Breast cancer Sister 60       TAH/BSO @ 44; currently 57  . Colon cancer Brother 32       lung cancer @ 62; currently 60  . Heart attack Maternal Grandmother   . Hernia Maternal Grandmother   . Stroke Paternal Grandmother 26  . Stroke Paternal Grandfather 38  . Cancer Brother        Esophageal in 30s; deceased at 26; non-smoker  . Heart attack Brother   . Cancer Maternal Grandfather        GI cancer in 27s; deceased 31s  . Breast cancer Maternal Aunt        dx 43s; deceased late 33s  . Cancer Paternal Uncle        2 pat uncles with GI cancer in late 70s/80  . Cancer Cousin        2 pat cousins with GI cancer <50yo (sons of uncle with GI cancer)  . Diabetes Sister   . Heart attack Brother     Current Outpatient Medications  Medication Sig Dispense Refill  . acetaminophen (TYLENOL) 500 MG tablet Take 1,000 mg by mouth 4 (four) times daily.    Marland Kitchen anastrozole (ARIMIDEX) 1 MG tablet Take 1 tablet (1 mg total) by mouth daily. 90 tablet 4  . aspirin EC 81 MG tablet Take 81 mg by mouth daily.    . B Complex Vitamins (VITAMIN B COMPLEX PO) Take 1 tablet by mouth daily.    Marland Kitchen b complex vitamins tablet Take 1 tablet by mouth daily.    . Calcium Carbonate-Vitamin D (CALTRATE 600+D PO) Take 1 tablet by mouth daily.    . Cholecalciferol (VITAMIN D3) 125 MCG (5000 UT) CAPS Take 5,000 Units by  mouth daily.    Marland Kitchen denosumab (PROLIA) 60 MG/ML SOLN injection Inject 60  mg into the skin every 6 (six) months. Administer in upper arm, thigh, or abdomen    . esomeprazole (NEXIUM) 40 MG capsule Take 40 mg by mouth daily before breakfast.     . famotidine-calcium carbonate-magnesium hydroxide (PEPCID COMPLETE) 10-800-165 MG chewable tablet Chew 1 tablet by mouth daily.    Marland Kitchen HYDROcodone-acetaminophen (NORCO/VICODIN) 5-325 MG tablet Take 1-2 tablets by mouth every 6 (six) hours as needed for moderate pain or severe pain. 15 tablet 0  . ibuprofen (ADVIL,MOTRIN) 200 MG tablet Take 200 mg by mouth at bedtime.    Marland Kitchen latanoprost (XALATAN) 0.005 % ophthalmic solution Place 1 drop into both eyes at bedtime.    . Multiple Vitamins-Minerals (HAIR/SKIN/NAILS) CAPS Take 1 capsule by mouth daily.    . rosuvastatin (CRESTOR) 20 MG tablet TAKE 1 TABLET BY MOUTH EVERY DAY 90 tablet 0   No current facility-administered medications for this visit.    Allergies  Allergen Reactions  . Shellfish Allergy Diarrhea, Nausea Only, Swelling and Rash  . Iodine Rash    Topical iodine     REVIEW OF SYSTEMS:  [X]  denotes positive finding, [ ]  denotes negative finding Cardiac  Comments:  Chest pain or chest pressure:    Shortness of breath upon exertion:    Short of breath when lying flat:    Irregular heart rhythm:        Vascular    Pain in calf, thigh, or hip brought on by ambulation:    Pain in feet at night that wakes you up from your sleep:     Blood clot in your veins:    Leg swelling:         Pulmonary    Oxygen at home:    Productive cough:     Wheezing:         Neurologic    Sudden weakness in arms or legs:     Sudden numbness in arms or legs:     Sudden onset of difficulty speaking or slurred speech:    Temporary loss of vision in one eye:     Problems with dizziness:         Gastrointestinal    Blood in stool:     Vomited blood:         Genitourinary    Burning when urinating:     Blood  in urine:        Psychiatric    Major depression:         Hematologic    Bleeding problems:    Problems with blood clotting too easily:        Skin    Rashes or ulcers:        Constitutional    Fever or chills:      PHYSICAL EXAMINATION:  Vitals:   06/07/20 1134  BP: (!) 105/58  Pulse: 74  Resp: 20  Temp: (!) 97.1 F (36.2 C)  TempSrc: Temporal  SpO2: 100%  Weight: 124 lb 1.6 oz (56.3 kg)  Height: 5\' 2"  (1.575 m)    General:  Very pleasant female, WDWN in NAD; vital signs documented above Gait: Normal HENT: WNL, normocephalic Pulmonary: normal non-labored breathing , without Rales, rhonchi,  wheezing Cardiac: regular HR, without  Murmurs without carotid bruit Abdomen: soft, NT, no masses Vascular Exam/Pulses:2+ radial pulses, 2+ femoral, popliteal, DP and PT bilaterally Extremities: without ischemic changes, without Gangrene , without cellulitis; without open wounds;  Musculoskeletal: no muscle wasting or atrophy  Neurologic: A&O X 3;  No focal weakness or paresthesias are detected Psychiatric:  The pt has Normal affect.   Non-Invasive Vascular Imaging:   Carotid duplex was independently reviewed and bilateral carotids showed velocities consistent with 1-39%stenosis bilaterally. She has antegrade flow in her vertebral arteries. Normal flow in bilateral subclavian arteries   ASSESSMENT/PLAN:: 86 y.o. female here for follow up for routine follow up of carotid stenosis. She has minimal bilateral carotid stenosis of 1-39%, antegrade vertebral artery flow and normal flow in her subclavian arteries bilaterally. She is without any attributable symptoms. Her blood pressure is well maintained. She will remain on Aspirin and statin. - Reviewed signs and symptoms of stroke and she knows should any of these occur she will go straight to ER -She will follow up in 2 years with carotid duplex   Graceann Congress, PA-C [facility] [phone]  Clinic MD:  Dr.  Chestine Spore

## 2020-06-08 ENCOUNTER — Ambulatory Visit: Payer: Self-pay

## 2020-06-08 ENCOUNTER — Encounter (HOSPITAL_COMMUNITY): Payer: Medicare HMO

## 2020-08-28 IMAGING — MG DIGITAL DIAGNOSTIC BILAT W/ TOMO W/ CAD
9 series · 9 of 25 positions shown · non-contrast
Comparison: Previous exam(s).

CLINICAL DATA: Recent lumpectomy for right breast carcinoma,
performed in January 2019.Previous history of a left lumpectomy for
breast carcinoma, from 2024. Patient is currently taking
anastrozole.

EXAM:
DIGITAL DIAGNOSTIC BILATERAL MAMMOGRAM WITH CAD AND TOMO

[R CC]
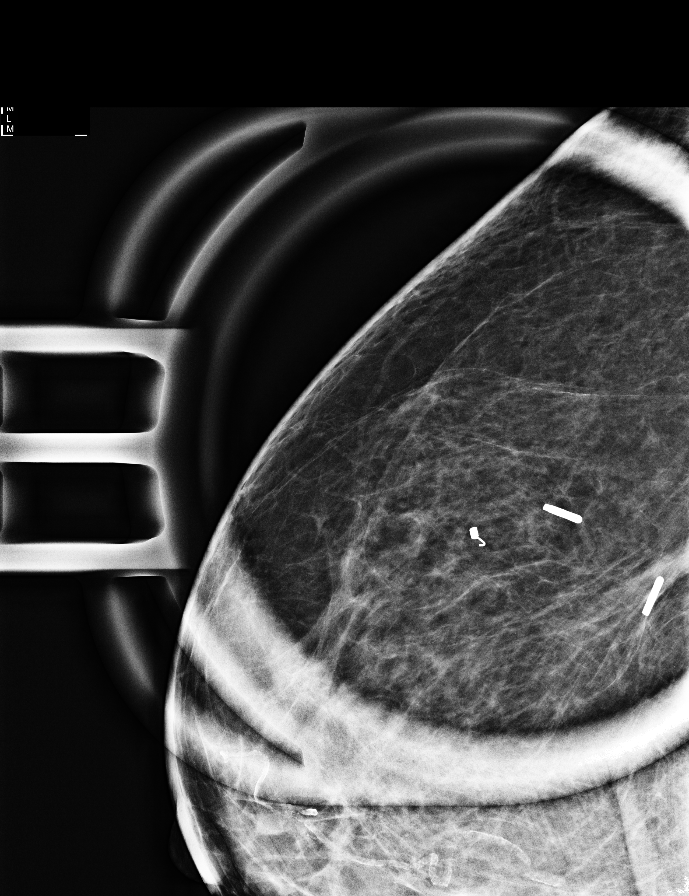

[L MLO synth-2D]
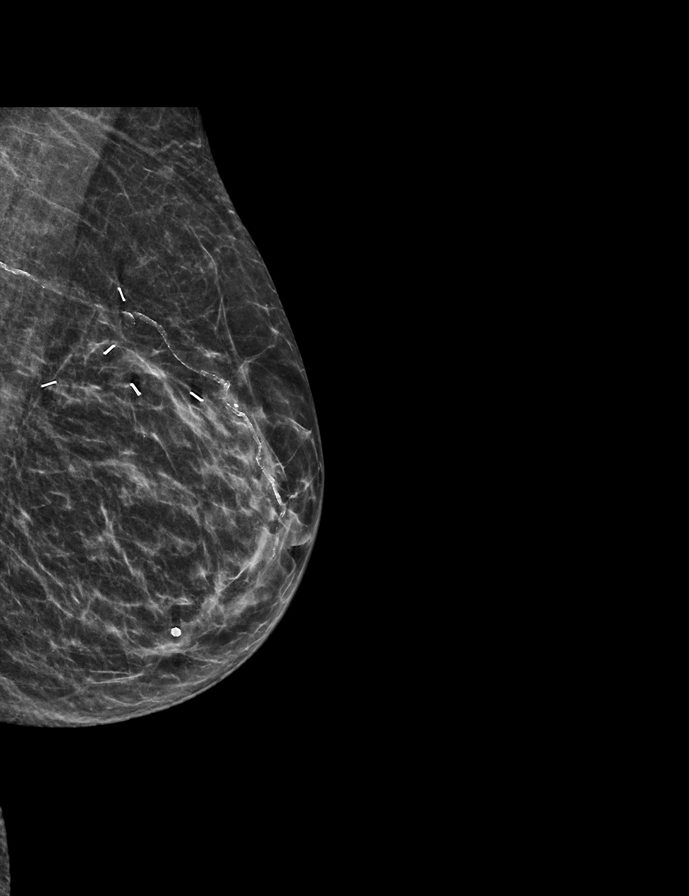

[R CC synth-2D]
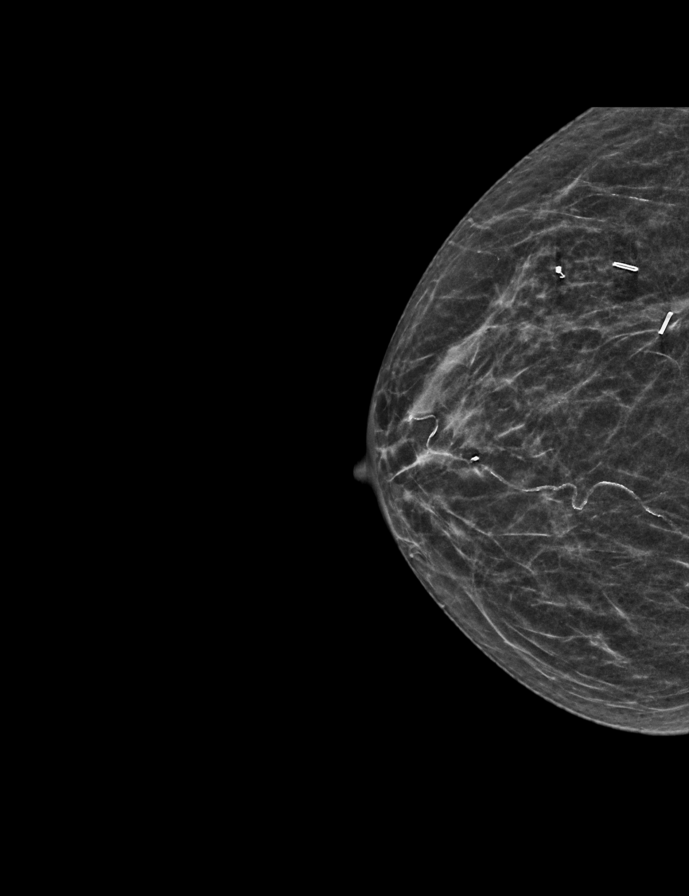

[R MLO synth-2D]
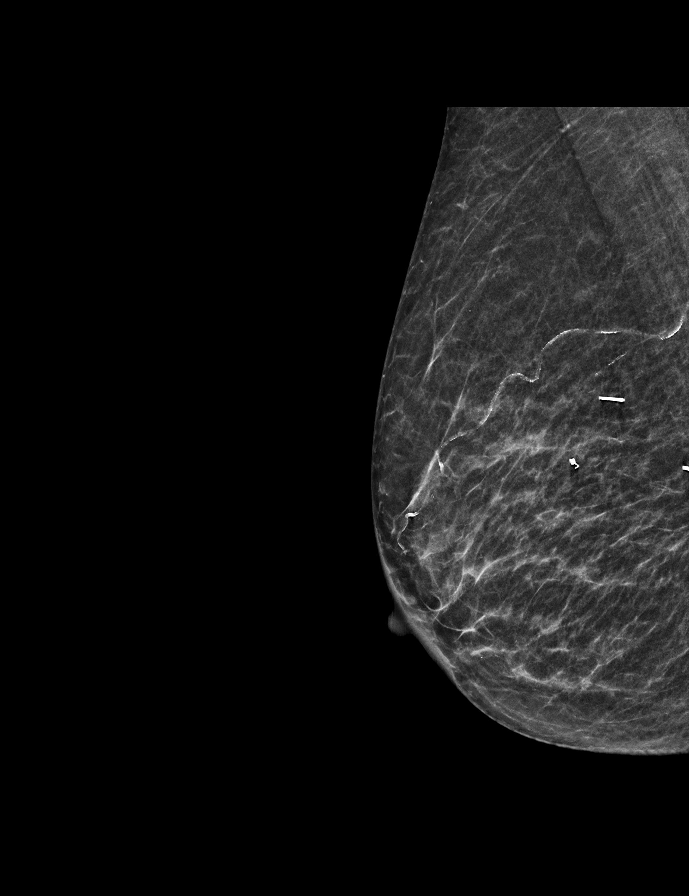

[L CC synth-2D]
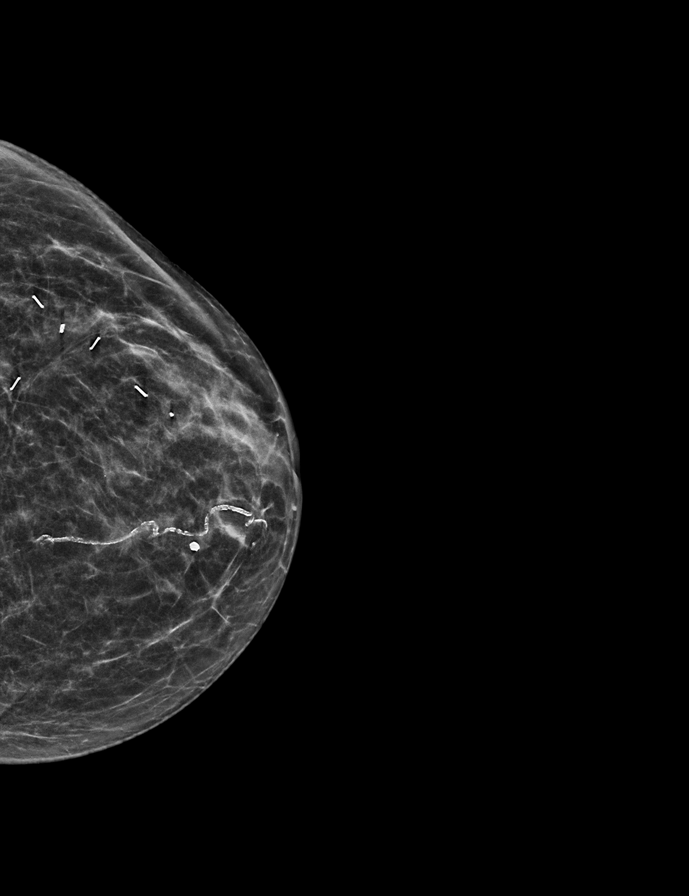

[R CC tomo · tomo slice 21/40.0]
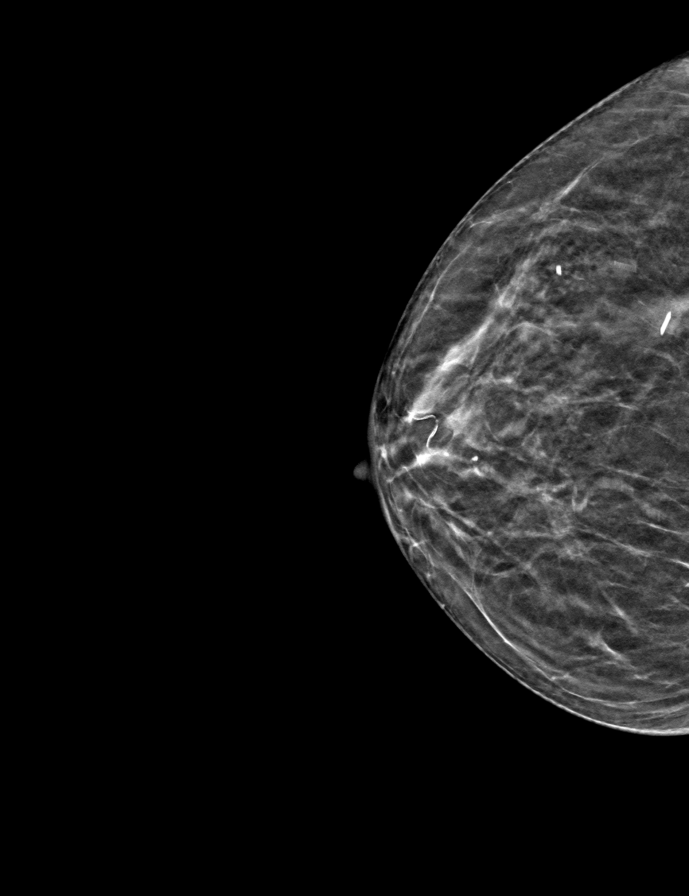

[L MLO tomo · tomo slice 25/48.0]
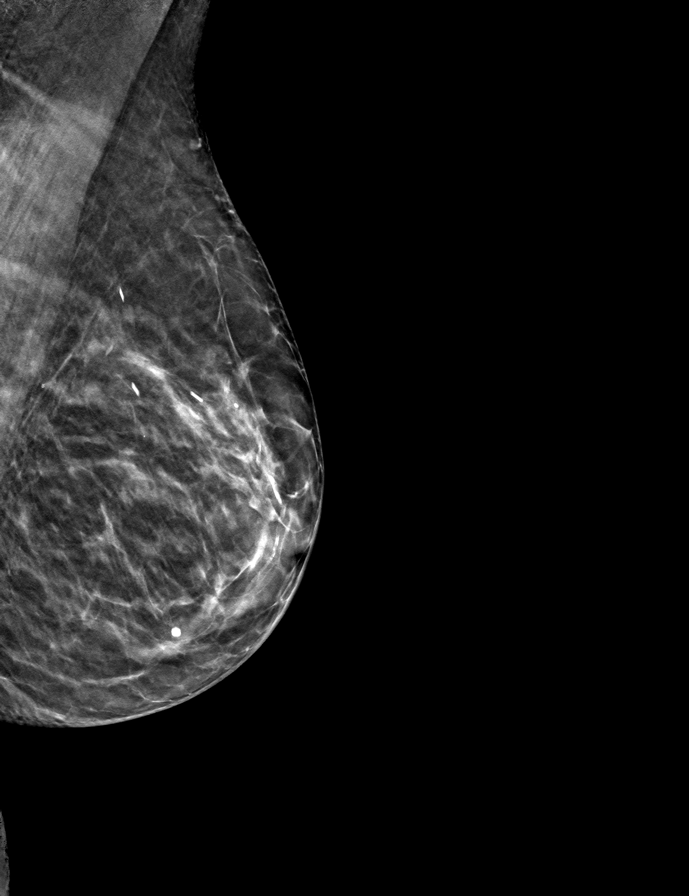

[R MLO tomo · tomo slice 20/39.0]
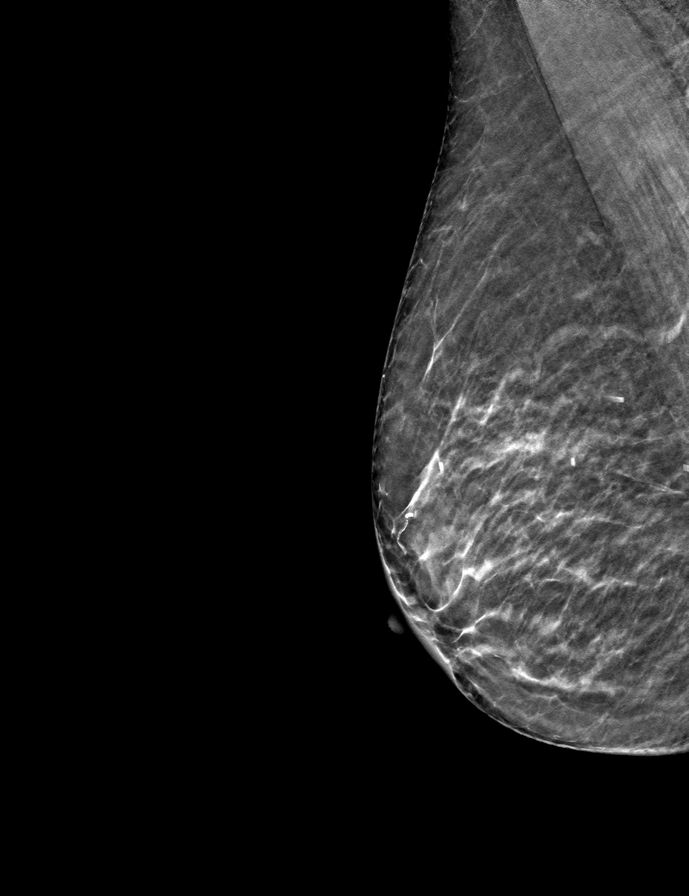

[L CC tomo · tomo slice 23/46.0]
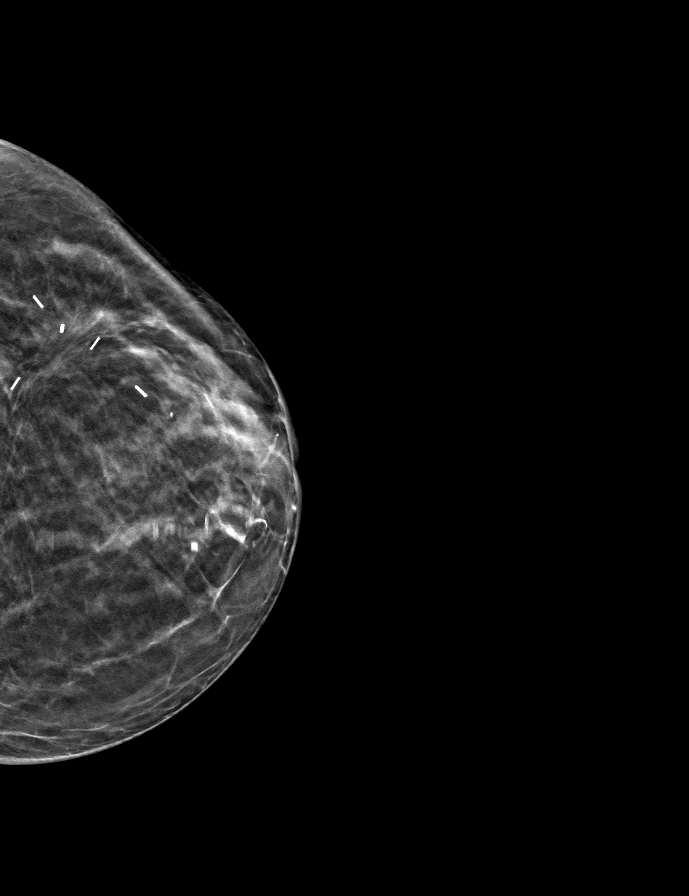

[9 of 25 positions shown; findings below may reference images not displayed]

ACR Breast Density Category b: There are scattered areas of
fibroglandular density.
FINDINGS: Postsurgical changes are noted in the posterolateral right breast,
with stable postsurgical changes noted in the upper outer left
breast.

There are no masses or areas of nonsurgical architectural
distortion. There are no new or suspicious calcifications.

Mammographic images were processed with CAD.
IMPRESSION: 1. No evidence of new or recurrent breast carcinoma.
2. Benign bilateral postsurgical changes.

RECOMMENDATION:
Diagnostic mammography in 1 year per standard post lumpectomy
protocol.

I have discussed the findings and recommendations with the patient.
If applicable, a reminder letter will be sent to the patient
regarding the next appointment.

BI-RADS CATEGORY  2: Benign.

## 2020-11-21 ENCOUNTER — Other Ambulatory Visit: Payer: Self-pay | Admitting: Oncology

## 2020-11-21 DIAGNOSIS — Z9889 Other specified postprocedural states: Secondary | ICD-10-CM

## 2020-11-28 ENCOUNTER — Other Ambulatory Visit: Payer: Self-pay

## 2020-11-28 ENCOUNTER — Ambulatory Visit
Admission: RE | Admit: 2020-11-28 | Discharge: 2020-11-28 | Disposition: A | Discharge status: Home / Self Care | Payer: Medicare Other | Source: Ambulatory Visit | Attending: Oncology | Admitting: Oncology

## 2020-11-28 DIAGNOSIS — Z9889 Other specified postprocedural states: Secondary | ICD-10-CM

## 2021-02-16 ENCOUNTER — Telehealth: Payer: Self-pay | Admitting: Oncology

## 2021-02-16 ENCOUNTER — Other Ambulatory Visit: Payer: Self-pay | Admitting: Oncology

## 2021-02-16 NOTE — Telephone Encounter (Signed)
Scheduled per sch msg. Called and spoke with patient. Confirmed appt  

## 2021-02-22 NOTE — Progress Notes (Signed)
[facility]  [phone] [phone]     ID: Angelica Roberts DOB: 11-16-1933  MR#: [id number]  GMW#:[id number]  Patient Care Team: Donald Prose, MD as PCP - General (Family Medicine) Nigel Mormon, MD as Consulting Physician (Cardiology) Jakaya Jacobowitz, Virgie Dad, MD as Consulting Physician (Oncology) Jovita Kussmaul, MD as Consulting Physician (General Surgery) Wilmon Arms, MD (Inactive) as Consulting Physician (Hematology and Oncology) OTHER MD:   CHIEF COMPLAINT: Invasive ductal carcinoma, estrogen receptor positive  CURRENT TREATMENT: Anastrozole   INTERVAL HISTORY: [name] returns today for follow-up of her noninvasive breast cancer.  She was last seen here in 03/2019.  She started anastrozole on her birthday on 02/27/2017.  So far she is tolerating it well, with no hot flashes or arthralgias or myalgias.  She does have vaginal dryness issues and she says she has frequent UTIs which she controls by increasing fluid intake.  Since her last visit, she underwent bilateral diagnostic mammography with tomography at [facility] on 11/28/2020 showing: breast density category B; no evidence of malignancy in either breast.   In addition she receives Prolia through Dr. Earnest Conroy  for her osteoporosis.  She has no side effects from this that she is aware of   REVIEW OF SYSTEMS: Elyssa is now living in [place], near [place].  It was a 2-1/2-hour drive for her to get here today.  She can still do it but "it is getting stressful".  Right hand feels "funny".  Her third digit will not easily bend.  She denies any numbness and she still finds it functional.  Aside from that a detailed review of systems today was stable   LEFT BREAST CANCER HISTORY: From Dr. Dana Allan original intake note 09/30/2013:   "Angelica Roberts is a 87 y.o. female. Who is seen in the multidisciplinary breast clinic for new diagnosis of DCIS. Patient underwent a screening  mammogram that showed suspicious calcifications in the upper-outer quadrant of the left breast. She had a biopsy performed that showed high grade ductal carcinoma in situ with associated necrosis and calcifications. MRI of the breasts showed biopsy changes in the upper outer quadrant of the left breast. No lymph nodes no evidence of any suspicious lesions on the right. Her case was discussed in the multidisciplinary breast conference. She is now seen in the [facility] clinic for discussion of treatment options."    RIGHT BREAST CANCER HISTORY: she underwent bilateral diagnostic mammography with tomography and right breast ultrasound at [facility] on 11/20/2017 showing: breast density category C; probable fibrocystic change at 9:30 o'clock of the right breast, six month follow up recommended.   She underwent repeat bilateral diagnostic mammography with tomography and right breast ultrasound at [facility] on 05/26/2018 showing: breast density category C; slight interval decrease in size of cluster of cysts right breast 9:30 o'clock.   She repeated the bilateral diagnostic mammography with tomography and right breast ultrasound at [facility] on 11/25/2018 showing: breast density category B; suspicious 4 mm mass in the right breast at 9 o'clock; the probably-benign right breast mass at 9:30 is stable to slightly smaller; no evidence of right axillary lymphadenopathy; no evidence of malignancy in the left breast.   Accordingly on 12/15/2018 she proceeded to biopsy of the right breast area in question. The pathology from this procedure ([id number]) showed:  1. Right Breast, 9:30             - fibrocystic changes, no malignancy 2. Right Breast,  9 o'clock             - invasive ductal carcinoma, grade 1-2. Prognostic indicators significant for: estrogen receptor, 100% positive and progesterone receptor, 10% positive, both with strong staining intensity. Proliferation marker Ki67 at 10%. HER2 negative  by immunohistochemistry (1+).  Her subsequent history is as detailed below.   PAST MEDICAL HISTORY: Past Medical History:  Diagnosis Date   Anemia    Aortic atherosclerosis ([place]) 09/22/2018   Arthritis    osteoarthritis   Bladder disorder June 2012   Sling   Bone spur of toe    right big toe   Cancer ([place]) 02/2004   Esophageal , Skin cancer - basal cell   Carotid artery occlusion    Bruit   Constipation    Coronary atherosclerosis 09/22/2018   DCIS (ductal carcinoma in situ) of breast    left   Gait abnormality    GERD (gastroesophageal reflux disease)    Glaucoma    Hearing impaired    History of hiatal hernia    History of kidney stones    Incontinence of bowel    Kidney stone    Osteoporosis    Personal history of radiation therapy    Postmenopausal HRT (hormone replacement therapy)    Radiation 11/12/13-12/03/13   left breast 42.72 gray   Shingles    Wears partial dentures    partial - lower    PAST SURGICAL HISTORY: Past Surgical History:  Procedure Laterality Date   BREAST BIOPSY Left 09/21/2013   BREAST LUMPECTOMY Left 10/09/2013   BREAST LUMPECTOMY Right 2020   BREAST LUMPECTOMY WITH NEEDLE LOCALIZATION Left 10/09/2013   Procedure: BREAST LUMPECTOMY WITH NEEDLE LOCALIZATION;  Surgeon: Merrie Roof, MD;  Location: [facility];  Service: General;  Laterality: Left;   BREAST LUMPECTOMY WITH RADIOACTIVE SEED LOCALIZATION Right 01/27/2019   Procedure: RIGHT BREAST LUMPECTOMY WITH RADIOACTIVE SEED LOCALIZATION;  Surgeon: Jovita Kussmaul, MD;  Location: [facility];  Service: General;  Laterality: Right;   [place] Bladder   COLONOSCOPY  2012   per patient   dental     dental implants   esopheageal cancer  02/2004   Removed    EXTRACORPOREAL SHOCK WAVE LITHOTRIPSY Right 02/25/2017   Procedure: RIGHT EXTRACORPOREAL SHOCK WAVE LITHOTRIPSY (ESWL);  Surgeon: Kathie Rhodes, MD;  Location: [facility];  Service: Urology;   Laterality: Right;   EYE SURGERY Bilateral 2017   cataracts   FOOT SURGERY Right July 2013   Bone spur   HERNIA REPAIR  1982   corrective surgery   INCONTINENCE SURGERY  june 2012   LITHOTRIPSY     VAGINAL HYSTERECTOMY  1977    FAMILY HISTORY Family History  Problem Relation Age of Onset   Hernia Mother        Hiatal   Diabetes Mother    Heart attack Mother 45   Stroke Mother 47   Hypertension Father    Stroke Father 26   Diabetes Father    Hyperlipidemia Father    Stroke Sister    Breast cancer Sister 70       TAH/BSO @ 33; currently 53   Colon cancer Brother 53       lung cancer @ 102; currently 62   Heart attack Maternal Grandmother    Hernia Maternal Grandmother    Stroke Paternal Grandmother 42   Stroke Paternal Grandfather 43   Cancer Brother  Esophageal in 26s; deceased at 17; non-smoker   Heart attack Brother    Cancer Maternal Grandfather        GI cancer in 98s; deceased 13s   Breast cancer Maternal Aunt        dx 29s; deceased late 85s   Cancer Paternal Uncle        2 pat uncles with GI cancer in late 41s/80   Cancer Cousin        2 pat cousins with GI cancer <50yo (sons of uncle with GI cancer)   Diabetes Sister    Heart attack Brother    the patient's father died at the age of 48, in his sleep, with a history of heart disease. The patient's mother died at the age 29. She has had a stroke and heart attack at the age of 45. Both her parents had diabetes. The patient has 3 brothers. One had esophageal cancer but died from a heart attack. A second brother is currently 59. He also has had a heart attack in the past. The third brother, 52, has a history of rheumatoid arthritis and has had both GI and lung cancers. The patient has 2 sisters. One had breast cancer diagnosed at age 38. The other one is 58.   GYNECOLOGIC HISTORY:  No LMP recorded. Patient has had a hysterectomy. Menarche age 81, first live birth age 3, the patient is [place] P3. She underwent  hysterectomy but she does not know whether with or without salpingo-oophorectomy at age 32. She did take hormone replacement until 08/02/03 when she developed her esophageal cancer.   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 Her second husband passed away in 08-01-18. She has 3 children from prior marriages: Karren Burly who lives in [place] and is in business administration, Kathlen Mody who lives in [place] and is says Economist; and Orilla Templeman who lives in [place] and is self-employed. The patient has 6 grandchildren and 6 great-grandchildren. She attends the [employer]: not on file, documents given 12/22/2013.   HEALTH MAINTENANCE: Social History   Tobacco Use   Smoking status: Never   Smokeless tobacco: Never  Substance Use Topics   Alcohol use: No    Alcohol/week: 0.0 standard drinks   Drug use: No     Colonoscopy:  PAP:  Bone density:  Lipid panel:  Allergies  Allergen Reactions   Shellfish Allergy Diarrhea, Nausea Only, Swelling and Rash   Iodine Rash    Topical iodine    Current Outpatient Medications  Medication Sig Dispense Refill   acetaminophen (TYLENOL) 500 MG tablet Take 1,000 mg by mouth 4 (four) times daily.     anastrozole (ARIMIDEX) 1 MG tablet Take 1 tablet (1 mg total) by mouth daily. 90 tablet 4   aspirin EC 81 MG tablet Take 81 mg by mouth daily.     B Complex Vitamins (VITAMIN B COMPLEX PO) Take 1 tablet by mouth daily.     b complex vitamins tablet Take 1 tablet by mouth daily.     Calcium Carbonate-Vitamin D (CALTRATE 600+D PO) Take 1 tablet by mouth daily.     Cholecalciferol (VITAMIN D3) 125 MCG (5000 UT) CAPS Take 5,000 Units by mouth daily.     denosumab (PROLIA) 60 MG/ML SOLN injection Inject 60 mg into the skin every 6 (six) months. Administer in upper arm, thigh, or abdomen  esomeprazole (NEXIUM) 40 MG capsule Take 40  mg by mouth daily before breakfast.      famotidine-calcium carbonate-magnesium hydroxide (PEPCID COMPLETE) 10-800-165 MG chewable tablet Chew 1 tablet by mouth daily.     ibuprofen (ADVIL,MOTRIN) 200 MG tablet Take 200 mg by mouth at bedtime.     latanoprost (XALATAN) 0.005 % ophthalmic solution Place 1 drop into both eyes at bedtime.     Multiple Vitamins-Minerals (HAIR/SKIN/NAILS) CAPS Take 1 capsule by mouth daily.     rosuvastatin (CRESTOR) 20 MG tablet TAKE 1 TABLET BY MOUTH EVERY DAY 90 tablet 0   No current facility-administered medications for this visit.    OBJECTIVE: White woman in no acute distress  Vitals with BMI 02/23/2021 06/07/2020 01/27/2019  Height _0  _1  -  Weight 122 lbs 10 oz 124 lbs 2 oz -  BMI 48.54 62.70 -  Systolic 350 093 818  Diastolic 44 58 56  Pulse 70 74 91    Body mass index is 22.42 kg/m.     ECOG FS:0 - Asymptomatic  Sclerae unicteric, EOMs intact Wearing a mask No cervical or supraclavicular adenopathy Lungs no rales or rhonchi Heart regular rate and rhythm Abd soft, nontender, positive bowel sounds MSK no focal spinal tenderness, no upper extremity lymphedema Neuro: nonfocal, well oriented, appropriate affect Breasts: She is status post bilateral lumpectomies.  There is no evidence of local recurrence.  Both axillae are benign.   LAB RESULTS:  CMP     Component Value Date/Time   NA 143 02/23/2021 1103   NA 144 09/30/2013 1159   K 4.1 02/23/2021 1103   K 3.7 09/30/2013 1159   CL 107 02/23/2021 1103   CO2 28 02/23/2021 1103   CO2 27 09/30/2013 1159   GLUCOSE 104 (H) 02/23/2021 1103   GLUCOSE 105 09/30/2013 1159   BUN 16 02/23/2021 1103   BUN 15.0 09/30/2013 1159   CREATININE 1.18 (H) 02/23/2021 1103   CREATININE 0.98 02/10/2018 0745   CREATININE 0.9 09/30/2013 1159   CALCIUM 10.2 02/23/2021 1103   CALCIUM 9.8 09/30/2013 1159   PROT 7.1 02/23/2021 1103   PROT 7.5 09/30/2013 1159   ALBUMIN 3.6 02/23/2021 1103   ALBUMIN 3.8  09/30/2013 1159   AST 21 02/23/2021 1103   AST 18 02/10/2018 0745   AST 21 09/30/2013 1159   ALT 15 02/23/2021 1103   ALT 13 02/10/2018 0745   ALT 15 09/30/2013 1159   ALKPHOS 65 02/23/2021 1103   ALKPHOS 74 09/30/2013 1159   BILITOT 1.0 02/23/2021 1103   BILITOT 0.8 02/10/2018 0745   BILITOT 0.81 09/30/2013 1159   GFRNONAA 46 (L) 02/23/2021 1103   GFRNONAA 53 (L) 02/10/2018 0745   GFRAA >60 08/28/2018 0239   GFRAA >60 02/10/2018 0745    I No results found for: SPEP  Lab Results  Component Value Date   WBC 5.6 02/23/2021   NEUTROABS 3.2 02/23/2021   HGB 11.5 (L) 02/23/2021   HCT 35.2 (L) 02/23/2021   MCV 92.6 02/23/2021   PLT 184 02/23/2021      Chemistry      Component Value Date/Time   NA 143 02/23/2021 1103   NA 144 09/30/2013 1159   K 4.1 02/23/2021 1103   K 3.7 09/30/2013 1159   CL 107 02/23/2021 1103   CO2 28 02/23/2021 1103   CO2 27 09/30/2013 1159   BUN 16 02/23/2021 1103   BUN 15.0 09/30/2013 1159   CREATININE 1.18 (H) 02/23/2021 1103   CREATININE  0.98 02/10/2018 0745   CREATININE 0.9 09/30/2013 1159      Component Value Date/Time   CALCIUM 10.2 02/23/2021 1103   CALCIUM 9.8 09/30/2013 1159   ALKPHOS 65 02/23/2021 1103   ALKPHOS 74 09/30/2013 1159   AST 21 02/23/2021 1103   AST 18 02/10/2018 0745   AST 21 09/30/2013 1159   ALT 15 02/23/2021 1103   ALT 13 02/10/2018 0745   ALT 15 09/30/2013 1159   BILITOT 1.0 02/23/2021 1103   BILITOT 0.8 02/10/2018 0745   BILITOT 0.81 09/30/2013 1159       No results found for: LABCA2  No components found for: LABCA125  No results for input(s): INR in the last 168 hours.  Urinalysis    Component Value Date/Time   COLORURINE YELLOW 08/26/2018 2013    STUDIES: No results found.    ASSESSMENT: 87 y.o. BRCA negative She[place] St[place] woman status post left upper outer quadrant biopsy 09/21/2013 ([id number]) for ductal carcinoma in situ, high-grade, estrogen receptor 100% positive, progesterone receptor  15% positive.  (1) status post left lumpectomy 10/09/2013 showing only atypical lobular hyperplasia, no residual DCIS ([id number])  (2) adjuvant radiation completed 12/03/2013  (3) genetic testing June 2015 was normal and did not reveal a mutation in these genes: ATM, BARD1, BRCA1, BRCA2, BRIP1, CDH1, CHEK2, EPCAM, MLH1, MRE11A, MSH2, MSH6, MUTYH, NBN, NF1, PALB2, PMS2, PTEN, RAD50, RAD51C, RAD51D, SMARCA4, STK11, and TP53  (4) the patient opted against antiestrogen therapy  (5) status post right breast upper outer quadrant biopsy 12/15/2018 for a clinical T1a N0, stage IA invasive ductal carcinoma, grade 1, estrogen and progesterone receptor positive, HER-2 not amplified, with an MIB-1-1 of 10%  (6) status post right lumpectomy 01/27/2019 for a pT1b pNX, stage IA invasive ductal carcinoma, grade 1, with negative margins  (7) started anastrozole 02/27/2019 (her b'day)  (a) the patient has a history of mild osteopenia, receives denosumab/Prolia through her primary care physician  (8) given good tolerance of anastrozole no radiation planned  (9) remote history of esophageal cancer status post surgery at [facility] 2005   PLAN: Sammie is now 2 years out from her most recent surgery for breast cancer.  There is no evidence of active disease.  This is very febrile.  She is tolerating anastrozole well and she is receiving Prolia through her [facility] with good tolerance.  At this point she would like to switch her care to [facility].  I think given her current living circumstances that is entirely reasonable.  She knows that we will be glad to see her again at any point in the future if and when the need arises.  Total encounter time 20 minutes.*   Faith Patricelli, Virgie Dad, MD  02/23/21 5:07 PM Medical Oncology and Hematology [facility] [place], [place] [address] Tel. [phone]    Fax. [phone]   I, Wilburn Mylar, am acting as scribe for Dr. Virgie Dad.  Arihana Ambrocio.  I, Lurline Del MD, have reviewed the above documentation for accuracy and completeness, and I agree with the above.   *Total Encounter Time as defined by the Centers for Medicare and Medicaid Services includes, in addition to the face-to-face time of a patient visit (documented in the note above) non-face-to-face time: obtaining and reviewing outside history, ordering and reviewing medications, tests or procedures, care coordination (communications with other health care professionals or caregivers) and documentation in the medical record.

## 2021-02-23 ENCOUNTER — Inpatient Hospital Stay: Payer: Medicare Other | Attending: Oncology | Admitting: Oncology

## 2021-02-23 ENCOUNTER — Other Ambulatory Visit: Payer: Self-pay

## 2021-02-23 ENCOUNTER — Inpatient Hospital Stay: Payer: Medicare Other

## 2021-02-23 VITALS — BP 121/44 | HR 70 | Temp 97.6°F | Resp 16 | Ht 62.0 in | Wt 122.6 lb

## 2021-02-23 DIAGNOSIS — Z170 Estrogen receptor positive status [ER+]: Secondary | ICD-10-CM

## 2021-02-23 DIAGNOSIS — Z79811 Long term (current) use of aromatase inhibitors: Secondary | ICD-10-CM | POA: Diagnosis not present

## 2021-02-23 DIAGNOSIS — Z79899 Other long term (current) drug therapy: Secondary | ICD-10-CM | POA: Diagnosis not present

## 2021-02-23 DIAGNOSIS — I700 Atherosclerosis of aorta: Secondary | ICD-10-CM | POA: Insufficient documentation

## 2021-02-23 DIAGNOSIS — C50412 Malignant neoplasm of upper-outer quadrant of left female breast: Secondary | ICD-10-CM

## 2021-02-23 DIAGNOSIS — Z87442 Personal history of urinary calculi: Secondary | ICD-10-CM | POA: Insufficient documentation

## 2021-02-23 DIAGNOSIS — Z85828 Personal history of other malignant neoplasm of skin: Secondary | ICD-10-CM | POA: Insufficient documentation

## 2021-02-23 DIAGNOSIS — C50411 Malignant neoplasm of upper-outer quadrant of right female breast: Secondary | ICD-10-CM | POA: Diagnosis present

## 2021-02-23 DIAGNOSIS — Z8501 Personal history of malignant neoplasm of esophagus: Secondary | ICD-10-CM | POA: Insufficient documentation

## 2021-02-23 DIAGNOSIS — K219 Gastro-esophageal reflux disease without esophagitis: Secondary | ICD-10-CM | POA: Diagnosis not present

## 2021-02-23 DIAGNOSIS — Z801 Family history of malignant neoplasm of trachea, bronchus and lung: Secondary | ICD-10-CM | POA: Insufficient documentation

## 2021-02-23 DIAGNOSIS — Z923 Personal history of irradiation: Secondary | ICD-10-CM | POA: Insufficient documentation

## 2021-02-23 DIAGNOSIS — I2510 Atherosclerotic heart disease of native coronary artery without angina pectoris: Secondary | ICD-10-CM | POA: Diagnosis not present

## 2021-02-23 DIAGNOSIS — Z7982 Long term (current) use of aspirin: Secondary | ICD-10-CM | POA: Diagnosis not present

## 2021-02-23 DIAGNOSIS — Z803 Family history of malignant neoplasm of breast: Secondary | ICD-10-CM | POA: Diagnosis not present

## 2021-02-23 DIAGNOSIS — D0510 Intraductal carcinoma in situ of unspecified breast: Secondary | ICD-10-CM

## 2021-02-23 DIAGNOSIS — M810 Age-related osteoporosis without current pathological fracture: Secondary | ICD-10-CM | POA: Diagnosis not present

## 2021-02-23 LAB — COMPREHENSIVE METABOLIC PANEL
ALT: 15 U/L (ref 0–44)
AST: 21 U/L (ref 15–41)
Albumin: 3.6 g/dL (ref 3.5–5.0)
Alkaline Phosphatase: 65 U/L (ref 38–126)
Anion gap: 8 (ref 5–15)
BUN: 16 mg/dL (ref 8–23)
CO2: 28 mmol/L (ref 22–32)
Calcium: 10.2 mg/dL (ref 8.9–10.3)
Chloride: 107 mmol/L (ref 98–111)
Creatinine, Ser: 1.18 mg/dL — ABNORMAL HIGH (ref 0.44–1.00)
GFR, Estimated: 46 mL/min — ABNORMAL LOW (ref >60)
Glucose, Bld: 104 mg/dL — ABNORMAL HIGH (ref 70–99)
Potassium: 4.1 mmol/L (ref 3.5–5.1)
Sodium: 143 mmol/L (ref 135–145)
Total Bilirubin: 1 mg/dL (ref 0.3–1.2)
Total Protein: 7.1 g/dL (ref 6.5–8.1)

## 2021-02-23 LAB — CBC WITH DIFFERENTIAL/PLATELET
Abs Immature Granulocytes: 0.01 10*3/uL (ref 0.00–0.07)
Basophils Absolute: 0.1 10*3/uL (ref 0.0–0.1)
Basophils Relative: 1 %
Eosinophils Absolute: 0.3 10*3/uL (ref 0.0–0.5)
Eosinophils Relative: 6 %
HCT: 35.2 % — ABNORMAL LOW (ref 36.0–46.0)
Hemoglobin: 11.5 g/dL — ABNORMAL LOW (ref 12.0–15.0)
Immature Granulocytes: 0 %
Lymphocytes Relative: 30 %
Lymphs Abs: 1.7 10*3/uL (ref 0.7–4.0)
MCH: 30.3 pg (ref 26.0–34.0)
MCHC: 32.7 g/dL (ref 30.0–36.0)
MCV: 92.6 fL (ref 80.0–100.0)
Monocytes Absolute: 0.4 10*3/uL (ref 0.1–1.0)
Monocytes Relative: 7 %
Neutro Abs: 3.2 10*3/uL (ref 1.7–7.7)
Neutrophils Relative %: 56 %
Platelets: 184 10*3/uL (ref 150–400)
RBC: 3.8 MIL/uL — ABNORMAL LOW (ref 3.87–5.11)
RDW: 13.4 % (ref 11.5–15.5)
WBC: 5.6 10*3/uL (ref 4.0–10.5)
nRBC: 0 % (ref 0.0–0.2)

## 2021-02-23 MED ORDER — ANASTROZOLE 1 MG PO TABS: 1.0000 mg | ORAL_TABLET | Freq: Every day | ORAL | 4 refills | Status: AC

## 2021-02-23 MED ORDER — ANASTROZOLE 1 MG PO TABS: 1.0000 mg | ORAL_TABLET | Freq: Every day | ORAL | 4 refills | Status: DC

## 2021-10-26 ENCOUNTER — Other Ambulatory Visit: Payer: Self-pay | Admitting: General Surgery

## 2021-10-26 ENCOUNTER — Other Ambulatory Visit: Payer: Self-pay | Admitting: Family Medicine

## 2021-10-26 DIAGNOSIS — Z1231 Encounter for screening mammogram for malignant neoplasm of breast: Secondary | ICD-10-CM

## 2021-11-30 ENCOUNTER — Ambulatory Visit
Admission: RE | Admit: 2021-11-30 | Discharge: 2021-11-30 | Disposition: A | Discharge status: Home / Self Care | Payer: Medicare Other | Source: Ambulatory Visit | Attending: General Surgery | Admitting: General Surgery

## 2021-11-30 DIAGNOSIS — Z1231 Encounter for screening mammogram for malignant neoplasm of breast: Secondary | ICD-10-CM

## 2022-11-16 ENCOUNTER — Other Ambulatory Visit: Payer: Self-pay | Admitting: General Surgery

## 2022-11-16 DIAGNOSIS — Z1231 Encounter for screening mammogram for malignant neoplasm of breast: Secondary | ICD-10-CM

## 2022-12-07 ENCOUNTER — Ambulatory Visit
Admission: RE | Admit: 2022-12-07 | Discharge: 2022-12-07 | Disposition: A | Discharge status: Home / Self Care | Payer: Medicare Other | Source: Ambulatory Visit | Attending: General Surgery | Admitting: General Surgery

## 2022-12-07 DIAGNOSIS — Z1231 Encounter for screening mammogram for malignant neoplasm of breast: Secondary | ICD-10-CM
